# Patient Record
Sex: Female | Born: 1980 | Race: White | Hispanic: No | Marital: Married | Smoking: Never smoker
Health system: Southern US, Community
[De-identification: ages and names within clinical notes are randomized; demographics above are authoritative.]

## PROBLEM LIST (undated history)

## (undated) ENCOUNTER — Inpatient Hospital Stay: Payer: Self-pay

## (undated) DIAGNOSIS — R112 Nausea with vomiting, unspecified: Secondary | ICD-10-CM

## (undated) DIAGNOSIS — Z9889 Other specified postprocedural states: Secondary | ICD-10-CM

## (undated) DIAGNOSIS — R519 Headache, unspecified: Secondary | ICD-10-CM

## (undated) DIAGNOSIS — D649 Anemia, unspecified: Secondary | ICD-10-CM

## (undated) DIAGNOSIS — F32A Depression, unspecified: Secondary | ICD-10-CM

## (undated) DIAGNOSIS — N200 Calculus of kidney: Secondary | ICD-10-CM

## (undated) DIAGNOSIS — Z87442 Personal history of urinary calculi: Secondary | ICD-10-CM

---

## 2011-02-04 ENCOUNTER — Emergency Department: Payer: Self-pay | Admitting: *Deleted

## 2011-02-06 LAB — PATHOLOGY REPORT

## 2013-08-05 ENCOUNTER — Inpatient Hospital Stay: Payer: Self-pay | Admitting: Obstetrics and Gynecology

## 2013-08-05 LAB — CBC WITH DIFFERENTIAL/PLATELET
BASOS ABS: 0 10*3/uL (ref 0.0–0.1)
Basophil %: 0.2 %
EOS ABS: 0 10*3/uL (ref 0.0–0.7)
Eosinophil %: 0.3 %
HCT: 37.9 % (ref 35.0–47.0)
HGB: 13.2 g/dL (ref 12.0–16.0)
LYMPHS PCT: 21.4 %
Lymphocyte #: 1.7 10*3/uL (ref 1.0–3.6)
MCH: 32 pg (ref 26.0–34.0)
MCHC: 34.9 g/dL (ref 32.0–36.0)
MCV: 92 fL (ref 80–100)
Monocyte #: 0.4 x10 3/mm (ref 0.2–0.9)
Monocyte %: 5.4 %
NEUTROS ABS: 5.7 10*3/uL (ref 1.4–6.5)
NEUTROS PCT: 72.7 %
Platelet: 239 10*3/uL (ref 150–440)
RBC: 4.13 10*6/uL (ref 3.80–5.20)
RDW: 15.7 % — ABNORMAL HIGH (ref 11.5–14.5)
WBC: 7.8 10*3/uL (ref 3.6–11.0)

## 2013-08-07 LAB — CBC WITH DIFFERENTIAL/PLATELET
BASOS PCT: 0.3 %
Basophil #: 0 10*3/uL (ref 0.0–0.1)
Eosinophil #: 0 10*3/uL (ref 0.0–0.7)
Eosinophil %: 0.1 %
HCT: 28.8 % — ABNORMAL LOW (ref 35.0–47.0)
HGB: 9.8 g/dL — ABNORMAL LOW (ref 12.0–16.0)
Lymphocyte #: 1.2 10*3/uL (ref 1.0–3.6)
Lymphocyte %: 8.3 %
MCH: 31.1 pg (ref 26.0–34.0)
MCHC: 33.8 g/dL (ref 32.0–36.0)
MCV: 92 fL (ref 80–100)
MONO ABS: 0.5 x10 3/mm (ref 0.2–0.9)
MONOS PCT: 3.5 %
Neutrophil #: 12.7 10*3/uL — ABNORMAL HIGH (ref 1.4–6.5)
Neutrophil %: 87.8 %
Platelet: 172 10*3/uL (ref 150–440)
RBC: 3.14 10*6/uL — ABNORMAL LOW (ref 3.80–5.20)
RDW: 16.3 % — AB (ref 11.5–14.5)
WBC: 14.4 10*3/uL — AB (ref 3.6–11.0)

## 2013-08-08 LAB — HEMOGLOBIN: HGB: 9.2 g/dL — ABNORMAL LOW (ref 12.0–16.0)

## 2014-10-08 NOTE — Op Note (Signed)
PATIENT NAME:  Armen PickupNNAS, Callahan D MR#:  [id number] DATE OF BIRTH:  1980-10-29  DATE OF PROCEDURE:  08/06/2013  PREOPERATIVE DIAGNOSES: Fetal intolerance to labor with meconium staining remote on delivery. Oligohydramnios for induction.   POSTOPERATIVE DIAGNOSES: Fetal intolerance to labor with meconium staining remote on delivery. Oligohydramnios for induction.   PROCEDURES: Urgent lower uterine segment cesarean section.  SURGEON: Elliot Gurneyarrie C Venetta Knee, M.D.   ASSISTANT: Acquanetta BellingAngela Lugiano   FINDINGS: Term liveborn infant with Apgars 8 and 9, weighing 3040 grams.   DESCRIPTION OF PROCEDURE: The patient was taken to the Operating Room and placed in supine position. After adequate spinal and epidural anesthesia instilled, the patient was prepped and draped in the usual sterile fashion. A Pfannenstiel skin incision was made three fingerbreadths above the pubic symphysis and carried sharply down to the fascia. Fascia was nicked in the midline and the incision was extended in a superolateral manner. The fascia was sharply and bluntly dissected off the rectus muscles. Midline of the muscle was identified and split. Peritoneum was grasped, cut, entered. Bladder blade was placed. A bladder flap was created. Uterine incision was made. The infant's head was delivered, bulb suctioned. Nuchal cord was identified. Thick  meconium was seen. Cord was clamped and cut. Infant was handed off to the awaiting pediatrician. Pitocin was started, the uterus was exteriorized and wrapped in a moist laparotomy sponge. The interior of the uterus was curetted with a moist lap sponge. Uterine incision was closed with a running locked chromic suture and a running Vicryl suture. The bladder flap was tacked back up to the incision. Belly was cleared of clots. Uterus was placed back into the abdomen. The uterus was shifted to the right and left. the gutters were cleared of clots. The muscle bellies were approximated and sewn with a running Vicryl  suture. On-Q trocars were placed. On-Q catheters were threaded. The patient's fundus was massaged, 4-0 Monocryl was placed along the skin edges. Benzoin and Steri-Strips were placed. A 4 x 4 was placed around the catheter of the  On-Q trocar and the uterus was found to be firm and clear urine was noted in the Foley bag.   ____________________________ Elliot Gurneyarrie C. Jennafer Gladue, MD cck:sg D: 08/16/2013 11:48:00 ET T: 08/17/2013 06:41:28 ET JOB#: [id number]  cc: Elliot Gurneyarrie C. Esthefany Herrig, MD, <Dictator> Elliot GurneyARRIE C Lynnleigh Soden MD ELECTRONICALLY SIGNED 08/20/2013 23:29

## 2014-10-25 NOTE — H&P (Signed)
L&D Evaluation:  History:  HPI 31 G3P0 at 41 +0 weeks . Variable decels noted on office nst. U/S by me AFI 5.5 cm total. C 1 cm /40%/ blt vtx   Patient's Medical History anemia, migraines   Patient's Surgical History none   Medications Pre Natal Vitamins   Allergies motrin/ aleve   Social History none   Family History Non-Contributory   ROS:  ROS All systems were reviewed.  HEENT, CNS, GI, GU, Respiratory, CV, Renal and Musculoskeletal systems were found to be normal.   Exam:  Vital Signs stable   General no apparent distress   Mental Status clear   Chest clear   Heart normal sinus rhythm   Abdomen gravid, non-tender   Estimated Fetal Weight Average for gestational age   Fetal Position vtx   Back no CVAT   Edema no edema   Pelvic 1 cm/ 40 / blt / vtx   Mebranes Intact   FHT 140  + accels   FHT Description Variable decelerations   Ucx irregular   Skin dry   Impression:  Impression 41 weeks , relative oligohydramnios , variable decels   Plan:  Plan cervidil induction   Electronic Signatures: Schermerhorn, Ihor Austinhomas J (MD)  (Signed 19-Feb-15 12:51)  Authored: L&D Evaluation   Last Updated: 19-Feb-15 12:51 by Suzy BouchardSchermerhorn, Thomas J (MD)

## 2014-11-09 LAB — OB RESULTS CONSOLE RPR: RPR: NONREACTIVE

## 2014-11-09 LAB — OB RESULTS CONSOLE HEPATITIS B SURFACE ANTIGEN: Hepatitis B Surface Ag: NEGATIVE

## 2014-11-09 LAB — OB RESULTS CONSOLE RUBELLA ANTIBODY, IGM: Rubella: IMMUNE

## 2014-11-09 LAB — OB RESULTS CONSOLE VARICELLA ZOSTER ANTIBODY, IGG: VARICELLA IGG: IMMUNE

## 2015-03-10 ENCOUNTER — Encounter: Payer: Self-pay | Admitting: *Deleted

## 2015-03-10 ENCOUNTER — Observation Stay
Admission: EM | Admit: 2015-03-10 | Discharge: 2015-03-10 | Disposition: A | Discharge status: Home / Self Care | Payer: BLUE CROSS/BLUE SHIELD | Attending: Obstetrics and Gynecology | Admitting: Obstetrics and Gynecology

## 2015-03-10 DIAGNOSIS — Z041 Encounter for examination and observation following transport accident: Secondary | ICD-10-CM

## 2015-03-10 DIAGNOSIS — O26892 Other specified pregnancy related conditions, second trimester: Secondary | ICD-10-CM | POA: Diagnosis not present

## 2015-03-10 DIAGNOSIS — Z3A26 26 weeks gestation of pregnancy: Secondary | ICD-10-CM | POA: Insufficient documentation

## 2015-03-10 NOTE — Discharge Summary (Signed)
Patient discharged home with family in stable condition, ambulatory with steady gait. Discharge instructions reviewed with patient including follow up appointments and when to seek medical attention.
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
## 2015-06-18 NOTE — L&D Delivery Note (Addendum)
Delivery Note  First Stage: Labor onset: 06/20/15@0000  Augmentation : AROM, Pitocin  Analgesia /Anesthesia intrapartum: Morphine x 2, Fentanyl x1, then Epidural AROM at 1351 - clear fluid with bloody show  Second Stage: Complete dilation at 2236 Onset of pushing at 2238 FHR second stage: baseline: 125 bmp/ moderate variability/ +accels/ variables to nadir of 90bpm during pushing with good return to baseline   Delivery of a viable female at 2315 by Carlean JewsMeredith Sigmon, CNM in ROA position - terminal meconium at delivery  Loose nuchal cord x 1 and reduced over the fetal head Cord double clamped after cessation of pulsation, cut by FOB Cord blood sample collected   Third Stage: Placenta delivered via Tomasa BlaseSchultz intact with 3 VC @ 2320 Placenta disposition: hospital disposal Uterine tone firm with massage / bleeding moderate and proceeded to minimal   3rd laceration identified - see Dr. Francesca OmanSchermerhorn's dictation for repair Anesthesia for repair: Epidural and 1% Lidocaine Est. Blood Loss (mL): 350 Instruments, sponges, and needle counts correct by Landry DykeSigmon and Dougherty, RN  Successful VBAC - Dr. Feliberto GottronSchermerhorn at bedside as supervisory physician   Mom to postpartum.  Baby to Couplet care / Skin to Skin.  Newborn: Birth Weight: Pending  Apgar Scores: 8,9 Feeding planned: Breast  Carlean JewsMeredith Sigmon, CNM

## 2015-06-20 ENCOUNTER — Encounter
Admission: EM | Disposition: A | Discharge status: Home / Self Care | Payer: Self-pay | Source: Home / Self Care | Attending: Obstetrics and Gynecology

## 2015-06-20 ENCOUNTER — Inpatient Hospital Stay
Admission: EM | Admit: 2015-06-20 | Discharge: 2015-06-22 | DRG: 775 | Disposition: A | Discharge status: Home / Self Care | Payer: BLUE CROSS/BLUE SHIELD | Attending: Obstetrics and Gynecology | Admitting: Obstetrics and Gynecology

## 2015-06-20 ENCOUNTER — Inpatient Hospital Stay: Payer: BLUE CROSS/BLUE SHIELD | Admitting: Anesthesiology

## 2015-06-20 DIAGNOSIS — O34211 Maternal care for low transverse scar from previous cesarean delivery: Secondary | ICD-10-CM | POA: Diagnosis present

## 2015-06-20 DIAGNOSIS — Z3A40 40 weeks gestation of pregnancy: Secondary | ICD-10-CM

## 2015-06-20 DIAGNOSIS — O480 Post-term pregnancy: Secondary | ICD-10-CM | POA: Diagnosis present

## 2015-06-20 LAB — CBC
HEMATOCRIT: 36.6 % (ref 35.0–47.0)
HEMOGLOBIN: 11.9 g/dL — AB (ref 12.0–16.0)
MCH: 27.4 pg (ref 26.0–34.0)
MCHC: 32.6 g/dL (ref 32.0–36.0)
MCV: 83.8 fL (ref 80.0–100.0)
Platelets: 217 10*3/uL (ref 150–440)
RBC: 4.37 MIL/uL (ref 3.80–5.20)
RDW: 18.6 % — AB (ref 11.5–14.5)
WBC: 8.9 10*3/uL (ref 3.6–11.0)

## 2015-06-20 LAB — TYPE AND SCREEN
ABO/RH(D): O POS
ANTIBODY SCREEN: NEGATIVE

## 2015-06-20 LAB — ABO/RH: ABO/RH(D): O POS

## 2015-06-20 SURGERY — Surgical Case
Anesthesia: Spinal

## 2015-06-20 MED ORDER — LIDOCAINE-EPINEPHRINE (PF) 1.5 %-1:200000 IJ SOLN
INTRAMUSCULAR | Status: DC | PRN
  Administered 2015-06-20: 3 mL via EPIDURAL

## 2015-06-20 MED ORDER — LACTATED RINGERS IV SOLN
500.0000 mL | INTRAVENOUS | Status: DC | PRN
  Administered 2015-06-20: 1000 mL via INTRAVENOUS

## 2015-06-20 MED ORDER — AMMONIA AROMATIC IN INHA
RESPIRATORY_TRACT | Status: AC
  Filled 2015-06-20: qty 10

## 2015-06-20 MED ORDER — FENTANYL 2.5 MCG/ML W/ROPIVACAINE 0.2% IN NS 100 ML EPIDURAL INFUSION (ARMC-ANES)
EPIDURAL | Status: AC
  Administered 2015-06-20: 10 mL/h via EPIDURAL
  Filled 2015-06-20: qty 100

## 2015-06-20 MED ORDER — BUPIVACAINE HCL (PF) 0.25 % IJ SOLN
INTRAMUSCULAR | Status: DC | PRN
  Administered 2015-06-20 (×2): 4 mL via EPIDURAL

## 2015-06-20 MED ORDER — DIPHENHYDRAMINE HCL 50 MG/ML IJ SOLN: 12.5000 mg | INTRAMUSCULAR | Status: DC | PRN

## 2015-06-20 MED ORDER — MORPHINE SULFATE (PF) 2 MG/ML IV SOLN
INTRAVENOUS | Status: AC
  Administered 2015-06-20: 1 mg via INTRAVENOUS
  Filled 2015-06-20: qty 1

## 2015-06-20 MED ORDER — TERBUTALINE SULFATE 1 MG/ML IJ SOLN
INTRAMUSCULAR | Status: AC
  Administered 2015-06-20: 0.25 mg via SUBCUTANEOUS
  Filled 2015-06-20: qty 1

## 2015-06-20 MED ORDER — TERBUTALINE SULFATE 1 MG/ML IJ SOLN: 0.2500 mg | Freq: Once | INTRAMUSCULAR | Status: DC | PRN

## 2015-06-20 MED ORDER — PHENYLEPHRINE 40 MCG/ML (10ML) SYRINGE FOR IV PUSH (FOR BLOOD PRESSURE SUPPORT)
80.0000 ug | PREFILLED_SYRINGE | INTRAVENOUS | Status: DC | PRN
  Filled 2015-06-20: qty 2

## 2015-06-20 MED ORDER — LACTATED RINGERS IV SOLN
INTRAVENOUS | Status: DC
  Administered 2015-06-20: 09:00:00 via INTRAVENOUS

## 2015-06-20 MED ORDER — ONDANSETRON HCL 4 MG/2ML IJ SOLN
INTRAMUSCULAR | Status: AC
  Administered 2015-06-20: 4 mg via INTRAVENOUS
  Filled 2015-06-20: qty 2

## 2015-06-20 MED ORDER — FENTANYL 2.5 MCG/ML W/ROPIVACAINE 0.2% IN NS 100 ML EPIDURAL INFUSION (ARMC-ANES)
9.0000 mL/h | EPIDURAL | Status: DC
Start: 2015-06-20 — End: 2015-06-21
  Administered 2015-06-20: 10 mL/h via EPIDURAL
  Filled 2015-06-20: qty 100

## 2015-06-20 MED ORDER — LACTATED RINGERS IV SOLN
500.0000 mL | INTRAVENOUS | Status: DC | PRN
Start: 2015-06-20 — End: 2015-06-21
  Administered 2015-06-20: 500 mL via INTRAVENOUS

## 2015-06-20 MED ORDER — FENTANYL CITRATE (PF) 100 MCG/2ML IJ SOLN
INTRAMUSCULAR | Status: AC
  Administered 2015-06-20: 50 ug via INTRAVENOUS
  Filled 2015-06-20: qty 2

## 2015-06-20 MED ORDER — MORPHINE SULFATE (PF) 2 MG/ML IV SOLN
1.0000 mg | Freq: Once | INTRAVENOUS | Status: AC | PRN
  Administered 2015-06-20 (×2): 1 mg via INTRAVENOUS

## 2015-06-20 MED ORDER — AMMONIA AROMATIC IN INHA
RESPIRATORY_TRACT | Status: AC
  Administered 2015-06-20: 14:00:00
  Filled 2015-06-20: qty 10

## 2015-06-20 MED ORDER — LACTATED RINGERS IV SOLN: INTRAVENOUS | Status: DC

## 2015-06-20 MED ORDER — FENTANYL CITRATE (PF) 100 MCG/2ML IJ SOLN
50.0000 ug | INTRAMUSCULAR | Status: DC | PRN
  Administered 2015-06-20: 50 ug via INTRAVENOUS

## 2015-06-20 MED ORDER — ONDANSETRON HCL 4 MG/2ML IJ SOLN
4.0000 mg | Freq: Once | INTRAMUSCULAR | Status: AC
  Administered 2015-06-20: 4 mg via INTRAVENOUS

## 2015-06-20 MED ORDER — EPHEDRINE 5 MG/ML INJ
10.0000 mg | INTRAVENOUS | Status: DC | PRN
  Filled 2015-06-20: qty 2

## 2015-06-20 MED ORDER — OXYTOCIN BOLUS FROM INFUSION: 500.0000 mL | INTRAVENOUS | Status: DC

## 2015-06-20 MED ORDER — LIDOCAINE HCL (PF) 1 % IJ SOLN
30.0000 mL | INTRAMUSCULAR | Status: DC | PRN
  Filled 2015-06-20: qty 30

## 2015-06-20 MED ORDER — MISOPROSTOL 200 MCG PO TABS
ORAL_TABLET | ORAL | Status: AC
  Filled 2015-06-20: qty 4

## 2015-06-20 MED ORDER — OXYTOCIN 40 UNITS IN LACTATED RINGERS INFUSION - SIMPLE MED
62.5000 mL/h | INTRAVENOUS | Status: DC
  Filled 2015-06-20: qty 1000

## 2015-06-20 MED ORDER — MORPHINE SULFATE (PF) 2 MG/ML IV SOLN: 1.0000 mg | INTRAVENOUS | Status: DC | PRN

## 2015-06-20 MED ORDER — LIDOCAINE HCL (PF) 1 % IJ SOLN
INTRAMUSCULAR | Status: AC
  Filled 2015-06-20: qty 30

## 2015-06-20 MED ORDER — OXYTOCIN 10 UNIT/ML IJ SOLN
INTRAMUSCULAR | Status: AC
  Filled 2015-06-20: qty 2

## 2015-06-20 MED ORDER — CITRIC ACID-SODIUM CITRATE 334-500 MG/5ML PO SOLN
30.0000 mL | ORAL | Status: DC | PRN
  Administered 2015-06-20: 30 mL via ORAL
  Filled 2015-06-20: qty 15

## 2015-06-20 MED ORDER — BUTORPHANOL TARTRATE 1 MG/ML IJ SOLN: 1.0000 mg | INTRAMUSCULAR | Status: DC | PRN

## 2015-06-20 MED ORDER — ACETAMINOPHEN 325 MG PO TABS: 650.0000 mg | ORAL_TABLET | ORAL | Status: DC | PRN

## 2015-06-20 MED ORDER — OXYTOCIN 40 UNITS IN LACTATED RINGERS INFUSION - SIMPLE MED
1.0000 m[IU]/min | INTRAVENOUS | Status: DC
  Administered 2015-06-20: 1 m[IU]/min via INTRAVENOUS
  Administered 2015-06-20: 3 m[IU]/min via INTRAVENOUS
  Administered 2015-06-20: 1 m[IU]/min via INTRAVENOUS

## 2015-06-20 MED ORDER — TERBUTALINE SULFATE 1 MG/ML IJ SOLN
0.2500 mg | Freq: Once | INTRAMUSCULAR | Status: AC
  Administered 2015-06-20: 0.25 mg via SUBCUTANEOUS

## 2015-06-20 MED ORDER — ONDANSETRON HCL 4 MG/2ML IJ SOLN
4.0000 mg | Freq: Four times a day (QID) | INTRAMUSCULAR | Status: DC | PRN
  Administered 2015-06-20: 4 mg via INTRAVENOUS
  Filled 2015-06-20: qty 2

## 2015-06-20 SURGICAL SUPPLY — 21 items
BARRIER ADHS 3X4 INTERCEED (GAUZE/BANDAGES/DRESSINGS) IMPLANT
CANISTER SUCT 3000ML (MISCELLANEOUS) IMPLANT
CATH KIT ON-Q SILVERSOAK 5IN (CATHETERS) IMPLANT
CHLORAPREP W/TINT 26ML (MISCELLANEOUS) IMPLANT
DRSG TELFA 3X8 NADH (GAUZE/BANDAGES/DRESSINGS) IMPLANT
ELECT CAUTERY BLADE 6.4 (BLADE) IMPLANT
GAUZE SPONGE 4X4 12PLY STRL (GAUZE/BANDAGES/DRESSINGS) IMPLANT
GLOVE BIO SURGEON STRL SZ8 (GLOVE) IMPLANT
GOWN STRL REUS W/ TWL LRG LVL3 (GOWN DISPOSABLE) IMPLANT
GOWN STRL REUS W/ TWL XL LVL3 (GOWN DISPOSABLE) IMPLANT
GOWN STRL REUS W/TWL LRG LVL3 (GOWN DISPOSABLE)
GOWN STRL REUS W/TWL XL LVL3 (GOWN DISPOSABLE)
NS IRRIG 1000ML POUR BTL (IV SOLUTION) IMPLANT
PACK C SECTION AR (MISCELLANEOUS) IMPLANT
PAD GROUND ADULT SPLIT (MISCELLANEOUS) IMPLANT
PAD OB MATERNITY 4.3X12.25 (PERSONAL CARE ITEMS) IMPLANT
PAD PREP 24X41 OB/GYN DISP (PERSONAL CARE ITEMS) IMPLANT
STRAP SAFETY BODY (MISCELLANEOUS) IMPLANT
SUT CHROMIC 1 CTX 36 (SUTURE) IMPLANT
SUT PLAIN GUT 0 (SUTURE) IMPLANT
SUT VIC AB 0 CT1 36 (SUTURE) IMPLANT

## 2015-06-20 NOTE — Progress Notes (Signed)
Patient ID: Krista [name], female   DOB: 08/15/1980, 35 y.o.   MRN: [id number] Cle in place . Recent cx check 5-6 cm . Pitocin off with MVU 170-180 Reassuring fetal monitoring  Spoke to the pt and she wishes to continue on with VBAC  Recheck cx at 2000 and reeval .

## 2015-06-20 NOTE — Anesthesia Procedure Notes (Signed)
Epidural Patient location during procedure: OB Start time: 06/20/2015 11:32 AM End time: 06/20/2015 11:35 AM  Staffing Resident/CRNA: Stormy FabianURTIS, Livier Hendel Performed by: resident/CRNA   Preanesthetic Checklist Completed: patient identified, site marked, surgical consent, pre-op evaluation, timeout performed, IV checked, risks and benefits discussed and monitors and equipment checked  Epidural Patient position: sitting Prep: Betadine Patient monitoring: heart rate, continuous pulse ox and blood pressure Approach: midline Location: L4-L5 Injection technique: LOR saline  Needle:  Needle type: Tuohy  Needle gauge: 18 G Needle length: 9 cm and 9 Needle insertion depth: 5 cm Catheter type: closed end flexible Catheter size: 20 Guage Catheter at skin depth: 9 cm Test dose: negative and 1.5% lidocaine with Epi 1:200 K  Assessment Sensory level: T10 Events: blood not aspirated, injection not painful, no injection resistance, negative IV test and no paresthesia  Additional Notes   Patient tolerated the insertion well without complications.-SATD -IVTD. No paresthesia. [facility] for VS and dosingReason for block:procedure for pain

## 2015-06-20 NOTE — Plan of Care (Signed)
CNM Sigmon at bedside for assessment and SVE. Plan to place IUPC for accurate uc tracing. Pt turned from side to her back. Immediate decel noted to 90's, patient states she does not feel well. B/P taken and had dropped low. IV fluid bolus infusing. Ammonia capsule to pt's face. Her color is pale and greyish lip color.  Turned to Left side lying posiiton. Pt states she is feeling improved at this time (1343). B/P improving.  Color returning to lips and face.  CNM remains at bedside. MD Schermerhorn called to confirm fetal position with US after CNM unsure with last SVE. Will inform MD of present.  Ellison Carwin Donaven Criswell RNC

## 2015-06-20 NOTE — Anesthesia Preprocedure Evaluation (Addendum)
Anesthesia Evaluation  Patient identified by MRN, date of birth, ID band Patient awake    Reviewed: Allergy & Precautions, H&P , NPO status , Patient's Chart, lab work & pertinent test results, reviewed documented beta blocker date and time   History of Anesthesia Complications Negative for: history of anesthetic complications  Airway Mallampati: III  TM Distance: >3 FB Neck ROM: full    Dental no notable dental hx. (+) Teeth Intact   Pulmonary neg pulmonary ROS,    Pulmonary exam normal breath sounds clear to auscultation       Cardiovascular Exercise Tolerance: Good negative cardio ROS Normal cardiovascular exam Rhythm:regular Rate:Normal     Neuro/Psych negative neurological ROS  negative psych ROS   GI/Hepatic negative GI ROS, Neg liver ROS,   Endo/Other  negative endocrine ROS  Renal/GU negative Renal ROS  negative genitourinary   Musculoskeletal   Abdominal   Peds  Hematology negative hematology ROS (+)   Anesthesia Other Findings   Reproductive/Obstetrics (+) Pregnancy                            Anesthesia Physical Anesthesia Plan  ASA: II  Anesthesia Plan: Regional and Epidural   Post-op Pain Management:    Induction:   Airway Management Planned:   Additional Equipment:   Intra-op Plan:   Post-operative Plan:   Informed Consent: I have reviewed the patients History and Physical, chart, labs and discussed the procedure including the risks, benefits and alternatives for the proposed anesthesia with the patient or authorized representative who has indicated his/her understanding and acceptance.     Plan Discussed with: CRNA  Anesthesia Plan Comments:         Anesthesia Quick Evaluation

## 2015-06-20 NOTE — Progress Notes (Addendum)
S:  Pt. Feeling nauseated and asking for medication  O:  VS: Blood pressure 95/51, pulse 88, temperature 98.1 F (36.7 C), temperature source Oral, resp. rate 18, height 5' (1.524 m), weight 76.204 kg (168 lb), SpO2 99 %.        FHR : baseline 120 bpm / variability Moderate / accelerations + / occasional variable and late decelerations with good return to baseline and good variability         Toco: contractions every 1-3 minutes / Moderate / MVUs - 180        Cervix : 5-6cm/90%/-1/vtx        Membranes: SROM - bloody show with few small clots  A: Active labor     FHR category 2  P: Dr. Feliberto GottronSchermerhorn at beside for evaluation - pt. Elects to proceed with VBAC     Reassess at 8 pm      Continue with pitocin off       Carlean JewsMeredith Tereso Unangst, CNM

## 2015-06-20 NOTE — OB Triage Note (Signed)
Pt presents to L&D with c/o contractions since 7am, but getting more regular around 0000. Reports good fetal movements, denies Leaking of fluid or vaginal bleeding. Plan to apply efm and toco, evaluate fetal and maternal well being and assess for labor.

## 2015-06-20 NOTE — H&P (Signed)
  OB ADMISSION/ HISTORY & PHYSICAL:  Admission Date: 06/20/2015  3:10 AM  Admit Diagnosis: 40+5 weeks previous LTCS with labor contractions   Krista [name] is a 35 y.o. female presenting for labor contractions at 40+5 weeks.  She is a previous LTCS by TJS in 2015 for Fetal distress.  She is planning to TOLAC/VBAC.    Prenatal History: [id number]   EDC : 06/15/2015, by LMP  [facility]  Prenatal course complicated by previous LTCS for fetal distress in 2015, history of PP depression - not currently on medications   Prenatal Labs: ABO, Rh:  O Positive Antibody:  Negative Rubella:   Immune Varicella: Immune  RPR:   NR HBsAg:   Negative HIV:   NR GTT: 88 GBS:   Negative  Medical / Surgical History :  Past medical history: History reviewed. No pertinent past medical history.   Past surgical history: History reviewed. No pertinent past surgical history.  Family History: History reviewed. No pertinent family history.   Social History:  reports that she has never smoked. She does not have any smokeless tobacco history on file. She reports that she does not drink alcohol or use illicit drugs.   Allergies: Ibuprofen and Naproxen    Current Medications at time of admission:  Prior to Admission medications   Medication Sig Start Date End Date Taking? Authorizing Provider  Prenatal Vit-Fe Fumarate-FA (PRENATAL MULTIVITAMIN) TABS tablet Take 1 tablet by mouth daily at 12 noon.    Historical Provider, MD     Review of Systems: Active FM onset of ctx @ 0000 currently every 2-10 minutes No LOF bloody show present  +nausea, +vomiting, no diarrhea, no constipation No SOB, CP, heart palpitations  Morphine x 2 has helped with pain    Physical Exam:  VS: Blood pressure 129/71, pulse 102, temperature 97.8 F (36.6 C), temperature source Oral, resp. rate 18, height 5' (1.524 m), weight 76.204 kg (168 lb).  General: alert and oriented, appears calm  Heart:  RRR Lungs: Clear lung fields Abdomen: Gravid, soft and non-tender, non-distended / uterus: non-tender / contractions palpated moderate Extremities: no edema  Genitalia / VE: Dilation: 3.5 Effacement (%): 80 Station: -1 Exam by:: MS CNM  FHR: baseline rate 125 bpm / variability Moderate / accelerations + / occasional variable and late decelerations with nadir to 90 bpm TOCO: every 2-10 minutes   Assessment: 40+[redacted]  weeks gestation Previous C/S - planning VBAC Latent stage of labor FHR category 2   Plan:  Admit to Birthplace Routine Labor and Delivery Orders Planning epidural when labs get back Consents for VBAC and C/S are signed  Dr. Feliberto GottronSchermerhorn notified of admission / plan of care  Carlean JewsMeredith Nyiesha Beever, CNM

## 2015-06-20 NOTE — Progress Notes (Signed)
S: Pt. Comfortable with epidural       Pitocin at 3 milliunits   O:  VS: Blood pressure 110/68, pulse 79, temperature 98.1 F (36.7 C), temperature source Oral, resp. rate 18, height 5' (1.524 m), weight 76.204 kg (168 lb), SpO2 99 %.        FHR : baseline 120 bpm / variability Moderate / accelerations +/ variable and late decelerations with contractions        Toco: contractions every 1.5-3 minutes / moderate / MVU 160-180        Cervix : Dilation: 3.5 Effacement (%): 80 Cervical Position: Middle Station: -1 Presentation: Vertex (remains slightly ballotable per MD) Exam by:: TJS        Membranes: AROM -clear/bloody show  A: Latent labor     FHR category 2  P: Pitocin off for category 2 strip      Position changed    Dr. Feliberto GottronSchermerhorn notified of fetal strip - he will come discuss with patient soon  Carlean JewsMeredith Adelfa Lozito, CNM

## 2015-06-20 NOTE — Progress Notes (Signed)
S:  Increase in nausea and heartburn  O:  VS: Blood pressure 106/65, pulse 96, temperature 98.7 F (37.1 C), temperature source Oral, resp. rate 18, height 5' (1.524 Krista), weight 76.204 kg (168 lb), SpO2 99 %.        FHR : baseline 125 bmp/ variability Modearate / accelerations + / occasional variable and early decelerations        Toco: contractions every 1-3 minutes / Moderate/ MVU 190-200        Cervix : Dilation: 9 Effacement (%): 90 Cervical Position: Middle Station: 0 Presentation: Vertex Exam by:: Krista [name],CNM        Membranes: AROM - moderate amounts of bright red vaginal bleeding  A: Active labor     FHR category 2     TOLAC  P: Sodium Citrate x 1 for heartburn      Dr. Feliberto GottronSchermerhorn notified - aware/ in-house      Continue expectant management       Titrate epidural down by 1 mg for patient to feel pressure     Anticipate VBAC  Krista [name], CNM

## 2015-06-20 NOTE — Progress Notes (Addendum)
Patient ID: Krista [name], female   DOB: 04/04/1981, 35 y.o.   MRN: [id number] CLE in place . Inadequate CTx pattern . VTX by U/s by me . Arom some bloody d/c . IUPC placed . Start Pitocin slowly if not adequate ctx pattern . Reassuring fetal monitoring .

## 2015-06-20 NOTE — Progress Notes (Signed)
S:  Pt. Feeling more pressure, nausea has resolved   O:  VS: Blood pressure 107/65, pulse 93, temperature 98.3 F (36.8 C), temperature source Oral, resp. rate 18, height 5' (1.524 m), weight 76.204 kg (168 lb), SpO2 99 %.        FHR : baseline 125 bpm / variability Moderate / accelerations + / occasional variable decelerations        Toco: contractions every 1-3 minutes / Moderate / MVU-150        Cervix : 7cm/90%/0/vtx        Membranes: clear with moderate bloody show  A: Active labor     FHR category 2   P: Fetal monitoring strip is reactive and reassuring      Restart low dose Pitocin 1milliunit x 1milliunit     Continue expectant management     Dr. Feliberto GottronSchermerhorn aware and agrees      Carlean JewsMeredith Lucio Litsey, CNM

## 2015-06-21 LAB — CBC
HEMATOCRIT: 33.6 % — AB (ref 35.0–47.0)
HEMOGLOBIN: 11.1 g/dL — AB (ref 12.0–16.0)
MCH: 28 pg (ref 26.0–34.0)
MCHC: 33 g/dL (ref 32.0–36.0)
MCV: 84.9 fL (ref 80.0–100.0)
Platelets: 205 10*3/uL (ref 150–440)
RBC: 3.95 MIL/uL (ref 3.80–5.20)
RDW: 19.6 % — ABNORMAL HIGH (ref 11.5–14.5)
WBC: 16 10*3/uL — ABNORMAL HIGH (ref 3.6–11.0)

## 2015-06-21 LAB — RPR: RPR Ser Ql: NONREACTIVE

## 2015-06-21 MED ORDER — PRENATAL MULTIVITAMIN CH
1.0000 | ORAL_TABLET | Freq: Every day | ORAL | Status: DC
  Administered 2015-06-21: 1 via ORAL
  Filled 2015-06-21: qty 1

## 2015-06-21 MED ORDER — ZOLPIDEM TARTRATE 5 MG PO TABS: 5.0000 mg | ORAL_TABLET | Freq: Every evening | ORAL | Status: DC | PRN

## 2015-06-21 MED ORDER — ONDANSETRON HCL 4 MG/2ML IJ SOLN: 4.0000 mg | INTRAMUSCULAR | Status: DC | PRN

## 2015-06-21 MED ORDER — OXYCODONE-ACETAMINOPHEN 5-325 MG PO TABS: 2.0000 | ORAL_TABLET | ORAL | Status: DC | PRN

## 2015-06-21 MED ORDER — BENZOCAINE-MENTHOL 20-0.5 % EX AERO
1.0000 "application " | INHALATION_SPRAY | CUTANEOUS | Status: DC | PRN
  Filled 2015-06-21 (×2): qty 56

## 2015-06-21 MED ORDER — DIBUCAINE 1 % RE OINT: 1.0000 "application " | TOPICAL_OINTMENT | RECTAL | Status: DC | PRN

## 2015-06-21 MED ORDER — DOCUSATE SODIUM 100 MG PO CAPS: 100.0000 mg | ORAL_CAPSULE | Freq: Two times a day (BID) | ORAL | Status: DC

## 2015-06-21 MED ORDER — SIMETHICONE 80 MG PO CHEW
80.0000 mg | CHEWABLE_TABLET | ORAL | Status: DC | PRN
  Administered 2015-06-21: 80 mg via ORAL
  Filled 2015-06-21: qty 1

## 2015-06-21 MED ORDER — PSYLLIUM 95 % PO PACK
1.0000 | PACK | Freq: Every day | ORAL | Status: DC
  Administered 2015-06-21 – 2015-06-22 (×2): 1 via ORAL
  Filled 2015-06-21 (×2): qty 1

## 2015-06-21 MED ORDER — MAGNESIUM HYDROXIDE 400 MG/5ML PO SUSP: 30.0000 mL | ORAL | Status: DC | PRN

## 2015-06-21 MED ORDER — OXYCODONE-ACETAMINOPHEN 5-325 MG PO TABS
1.0000 | ORAL_TABLET | ORAL | Status: DC | PRN
  Administered 2015-06-21: 1 via ORAL
  Administered 2015-06-21: 0.5 via ORAL
  Administered 2015-06-21: 1 via ORAL
  Administered 2015-06-21: 0.5 via ORAL
  Administered 2015-06-21 – 2015-06-22 (×2): 1 via ORAL
  Filled 2015-06-21 (×6): qty 1

## 2015-06-21 MED ORDER — ONDANSETRON HCL 4 MG PO TABS
4.0000 mg | ORAL_TABLET | ORAL | Status: DC | PRN
Start: 2015-06-21 — End: 2015-06-22

## 2015-06-21 MED ORDER — DOCUSATE SODIUM 100 MG PO CAPS
100.0000 mg | ORAL_CAPSULE | Freq: Two times a day (BID) | ORAL | Status: DC
  Administered 2015-06-21 – 2015-06-22 (×3): 100 mg via ORAL
  Filled 2015-06-21 (×3): qty 1

## 2015-06-21 MED ORDER — SENNOSIDES-DOCUSATE SODIUM 8.6-50 MG PO TABS: 2.0000 | ORAL_TABLET | ORAL | Status: DC

## 2015-06-21 MED ORDER — LANOLIN HYDROUS EX OINT: TOPICAL_OINTMENT | CUTANEOUS | Status: DC | PRN

## 2015-06-21 MED ORDER — DIPHENHYDRAMINE HCL 25 MG PO CAPS: 25.0000 mg | ORAL_CAPSULE | Freq: Four times a day (QID) | ORAL | Status: DC | PRN

## 2015-06-21 MED ORDER — ACETAMINOPHEN 325 MG PO TABS: 650.0000 mg | ORAL_TABLET | ORAL | Status: DC | PRN

## 2015-06-21 MED ORDER — WITCH HAZEL-GLYCERIN EX PADS
1.0000 | MEDICATED_PAD | CUTANEOUS | Status: DC | PRN
Start: 2015-06-21 — End: 2015-06-22

## 2015-06-21 NOTE — Progress Notes (Signed)
PPD #1, VBAC  S:  Reports feeling good, did not get as much rest last night, having some vaginal/perineal pain but relief with pain medication             Tolerating po/ No nausea or vomiting             Bleeding is light             Pain controlled withTylenol and Percocet              Up ad lib / ambulatory / voiding QS  Newborn breast feeding - going well   O:               VS: BP 110/56 mmHg  Pulse 103  Temp(Src) 98.7 F (37.1 C) (Oral)  Resp 18  Ht 5' (1.524 m)  Wt 76.204 kg (168 lb)  BMI 32.81 kg/m2  SpO2 98%  Breastfeeding? Unknown   LABS:               Recent Labs  06/20/15 0924 06/21/15 0653  WBC 8.9 16.0*  HGB 11.9* 11.1*  PLT 217 205               Blood type: --/--/O POS (01/03 0925)  Rubella: Immune (05/25 0000)                     I&O: Intake/Output      01/03 0701 - 01/04 0700 01/04 0701 - 01/05 0700   I.V. (mL/kg) 1906.4 (25)    Total Intake(mL/kg) 1906.4 (25)    Urine (mL/kg/hr) 1550 (0.8)    Blood 700 (0.4)    Total Output 2250     Net -343.6                        Physical Exam:             Alert and oriented X3  Lungs: Clear and unlabored  Heart: regular rate and rhythm / no mumurs  Abdomen: soft, non-tender, non-distended              Fundus: firm, non-tender, U-E  Perineum: well approximated 3rd degree laceration healing well - no significant erythema, slight edema   Lochia: light, no clots noted   Extremities: no edema, no calf pain or tenderness    A: PPD # 1, VBAC  Doing well - stable status  P: Routine post partum orders  See Lactation today  May shower today and ambulate in halls  Colace BID and Metamucil daily   Encourage Dermoplast spray   Anticipate discharge home tomorrow   Carlean JewsMeredith Maui Ahart, CNM

## 2015-06-21 NOTE — Progress Notes (Signed)
Pt was assisted to bathroom, voided, shown pericare, clean gown and pad with icepack on.  Pt was then transferred via w/c to PP rm 346 in stable condition with IV infusing.

## 2015-06-21 NOTE — Op Note (Signed)
NAMTresa Res:  [name], Krista [name]              ACCOUNT NO.:  [id number]  MEDICAL RECORD NO.:  [id number]  LOCATION:  LDR4                         FACILITY:  [facility]  PHYSICIAN:  Jennell Cornerhomas Arden Axon, MDDATE OF BIRTH:  July 06, 1980  DATE OF PROCEDURE:  06/21/2015 DATE OF DISCHARGE:                              OPERATIVE REPORT   PREOPERATIVE DIAGNOSIS:  Third-degree perineal laceration status post spontaneous vaginal delivery.  POSTOPERATIVE DIAGNOSIS:  Third-degree perineal laceration status post spontaneous vaginal delivery.  PROCEDURE:  Repair of third-degree perineal laceration.  ANESTHESIA:  Continuous lumbar epidural and 1% local anesthetic.  DESCRIPTION OF PROCEDURE:  The patient was delivered by Sherrilee GillesMeredith Sigman, certified nurse midwife with me as the supervisory staff.  After the placenta was delivered, it was noted that the patient had a third-degree laceration.  Cervix was visualized and there were no apparent lacerations of the cervix.  A 1% lidocaine was used to anesthetize the sphincter capsule.  Each capsule was grasped with an Allis clamp with healthy capsular tissue noted.  Rectal exam was performed at this point, which demonstrated no mucosal tears.  The sphincter muscle was reapproximated with 2 separate figure-of-eight sutures with good approximation of the muscle.  The rectal exam again demonstrated good repair.  The vaginal laceration was noted with the apex identified and a running locking 2-0 Vicryl suture was used to reapproximate the vaginal laceration.  Several additional figure-of-eight sutures were required for good hemostasis.  The perineal body was reapproximated with 2 separate crown sutures using 2-0 Vicryl suture, good approximation of the perineal body and a subcuticular suture was used to reapproximate the skin with 3-0 Vicryl suture.  A final rectal exam was performed. Good tone noted.  No defects noted.  No suture in the rectum, and adequate repair with good  hemostasis.  Please reference nurse-midwife Sigmun's delivery note for additional details.          ______________________________ Jennell Cornerhomas Yamir Carignan, MD     TS/MEDQ  D:  06/21/2015  T:  06/21/2015  Job:  [id number]

## 2015-06-21 NOTE — Progress Notes (Signed)
Pt complete and setup for pushing. M. Sigmon, CNM at bedside.

## 2015-06-21 NOTE — Progress Notes (Signed)
Pt began vomitting again.   Clean linens and emesis bag

## 2015-06-21 NOTE — Anesthesia Postprocedure Evaluation (Signed)
Anesthesia Post Note  Patient: Krista [name]  Procedure(s) Performed: * No procedures listed *  Patient location during evaluation: Mother Baby Anesthesia Type: Epidural Level of consciousness: awake, awake and alert and oriented Pain management: pain level controlled Vital Signs Assessment: post-procedure vital signs reviewed and stable Respiratory status: spontaneous breathing and nonlabored ventilation Cardiovascular status: blood pressure returned to baseline and stable Postop Assessment: no headache and no backache Anesthetic complications: no    Last Vitals:  Filed Vitals:   06/21/15 0208 06/21/15 0429  BP: 114/68 110/56  Pulse: 97 103  Temp: 37 C 37.1 C  Resp: 20 18    Last Pain:  Filed Vitals:   06/21/15 0429  PainSc: 2                  Ambrose Wile,  Sheran FavaMark R

## 2015-06-21 NOTE — Progress Notes (Signed)
Patient ID: Armen PickupKimberly D Gladd, female   DOB: 05/27/1981, 35 y.o.   MRN: [id number] 3rd degree laceration repaired by me .  See op note dictated tonight.

## 2015-06-22 MED ORDER — OXYCODONE-ACETAMINOPHEN 5-325 MG PO TABS: 1.0000 | ORAL_TABLET | ORAL | Status: DC | PRN

## 2015-06-22 MED ORDER — DOCUSATE SODIUM 100 MG PO CAPS: 100.0000 mg | ORAL_CAPSULE | Freq: Two times a day (BID) | ORAL | Status: DC

## 2015-06-22 NOTE — Discharge Summary (Signed)
Obstetric Discharge Summary Reason for Admission: VBAC Prenatal Procedures: none Intrapartum Procedures: GBS prophylaxis Postpartum Procedures: repair of a third degree tear Complications-Operative and Postpartum: 3 degree perineal laceration HEMOGLOBIN  Date Value Ref Range Status  06/21/2015 11.1* 12.0 - 16.0 g/dL Final   HGB  Date Value Ref Range Status  08/08/2013 9.2* 12.0-16.0 g/dL Final   HCT  Date Value Ref Range Status  06/21/2015 33.6* 35.0 - 47.0 % Final  08/07/2013 28.8* 35.0-47.0 % Final    Physical Exam:  General: alert and cooperative  Lungs cta  cv RRR Lochia: appropriate Uterine Fundus: firm Incision: healing well DVT Evaluation: No evidence of DVT seen on physical exam.  Discharge Diagnoses: Term Pregnancy-delivered and third degree lac , successful VBAC  Discharge Information: Date: 06/22/2015 Activity: pelvic rest Diet: routine Medications: Colace, Percocet and metamucil Condition: stable Instructions: refer to practice specific booklet Discharge to: home Follow-up Information    Follow up with Celia Gibbons, MD In 2 weeks.   Specialty:  Obstetrics and Gynecology   Why:  For wound re-check   Contact information:   [address] [facility] [place] [address] [phone]       Newborn Data: Live born female  Birth Weight: 6 lb 14.8 oz (3140 g) APGAR: 8, 9  Home with mother.  Rilen Shukla 06/22/2015, 9:21 AM

## 2015-06-22 NOTE — Discharge Instructions (Signed)
Postpartum Care After Vaginal Delivery °After you deliver your newborn (postpartum period), the usual stay in the hospital is 24-72 hours. If there were problems with your labor or delivery, or if you have other medical problems, you might be in the hospital longer.  °While you are in the hospital, you will receive help and instructions on how to care for yourself and your newborn during the postpartum period.  °While you are in the hospital: °· Be sure to tell your nurses if you have pain or discomfort, as well as where you feel the pain and what makes the pain worse. °· If you had an incision made near your vagina (episiotomy) or if you had some tearing during delivery, the nurses may put ice packs on your episiotomy or tear. The ice packs may help to reduce the pain and swelling. °· If you are breastfeeding, you may feel uncomfortable contractions of your uterus for a couple of weeks. This is normal. The contractions help your uterus get back to normal size. °· It is normal to have some bleeding after delivery. °¨ For the first 1-3 days after delivery, the flow is red and the amount may be similar to a period. °¨ It is common for the flow to start and stop. °¨ In the first few days, you may pass some small clots. Let your nurses know if you begin to pass large clots or your flow increases. °¨ Do not  flush blood clots down the toilet before having the nurse look at them. °¨ During the next 3-10 days after delivery, your flow should become more watery and pink or brown-tinged in color. °¨ Ten to fourteen days after delivery, your flow should be a small amount of yellowish-white discharge. °¨ The amount of your flow will decrease over the first few weeks after delivery. Your flow may stop in 6-8 weeks. Most women have had their flow stop by 12 weeks after delivery. °· You should change your sanitary pads frequently. °· Wash your hands thoroughly with soap and water for at least 20 seconds after changing pads, using  the toilet, or before holding or feeding your newborn. °· You should feel like you need to empty your bladder within the first 6-8 hours after delivery. °· In case you become weak, lightheaded, or faint, call your nurse before you get out of bed for the first time and before you take a shower for the first time. °· Within the first few days after delivery, your breasts may begin to feel tender and full. This is called engorgement. Breast tenderness usually goes away within 48-72 hours after engorgement occurs. You may also notice milk leaking from your breasts. If you are not breastfeeding, do not stimulate your breasts. Breast stimulation can make your breasts produce more milk. °· Spending as much time as possible with your newborn is very important. During this time, you and your newborn can feel close and get to know each other. Having your newborn stay in your room (rooming in) will help to strengthen the bond with your newborn.  It will give you time to get to know your newborn and become comfortable caring for your newborn. °· Your hormones change after delivery. Sometimes the hormone changes can temporarily cause you to feel sad or tearful. These feelings should not last more than a few days. If these feelings last longer than that, you should talk to your caregiver. °· If desired, talk to your caregiver about methods of family planning or contraception. °·   Talk to your caregiver about immunizations. Your caregiver may want you to have the following immunizations before leaving the hospital:  Tetanus, diphtheria, and pertussis (Tdap) or tetanus and diphtheria (Td) immunization. It is very important that you and your family (including grandparents) or others caring for your newborn are up-to-date with the Tdap or Td immunizations. The Tdap or Td immunization can help protect your newborn from getting ill.  Rubella immunization.  Varicella (chickenpox) immunization.  Influenza immunization. You should  receive this annual immunization if you did not receive the immunization during your pregnancy.   Please call your doctor or return to the ER if you experience any chest pains, shortness of breath, fever greater than 101, any heavy bleeding or large clots, and foul smelling vaginal discharge, any worsening abdominal pain & cramping that is not controlled by pain medication, or any signs of post partum depression.  No tampons, enemas, douches, or sexual intercourse for 6 weeks.  Also avoid tub baths, hot tubs, or swimming for 6 weeks.

## 2015-06-22 NOTE — Progress Notes (Signed)
D/C order from MD.  Reviewed d/c instructions and prescriptions with patient and answered any questions.  Patient d/c home with infant via wheelchair by nursing/auxillary. 

## 2015-07-04 ENCOUNTER — Ambulatory Visit
Admission: RE | Admit: 2015-07-04 | Discharge: 2015-07-04 | Disposition: A | Discharge status: Home / Self Care | Payer: BLUE CROSS/BLUE SHIELD | Source: Ambulatory Visit | Attending: Obstetrics and Gynecology | Admitting: Obstetrics and Gynecology

## 2015-07-04 DIAGNOSIS — Z029 Encounter for administrative examinations, unspecified: Secondary | ICD-10-CM | POA: Diagnosis present

## 2015-07-04 NOTE — Lactation Note (Addendum)
Lactation Consultation Note  Patient Name: VONITA CALLOWAY BJYNW'G Date: 07/04/2015     Maternal Data  Mom has been having sore nipples during feedings since "Aubrey's" DOB: [birth year]20[birth year]. She assumed that it was her baby not opening her mouth wide enough to latch on correctly. 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  Feeding  [place] was cueing before being placed on the scale for a pre/post weight. She is up 2lbs from her birth weight in just 2 weeks and is voiding and stooling appropriately. Baby took 50mL during 20 min feeding.   LATCH Score/Interventions  Baby was not fed skin to skin    Mom was able to position baby correctly and latch "[place]" on   "[place]" was able to sustain her latch during the feeding only taking small breaks inbetween swallows which were consistent.   No tools were used for this feeding (comfort gels for afterwards)   Mom's nipples were same size, shape, and color when "[place]" came off. Her breasts are soft/tender, but her nipples are still a bit sore due to the incorrect latching on over the past few weeks.        Lactation Tools Discussed/Used  Mom may use expressed breastmilk on her nipples or comfort gels to combat soreness.   Consult Status  Instructed mom to continue nursing "[place]" when she is showing signs of hunger. "[place]" is curling in her bottom lip as soon as she latches on and during feeds. Taught mom how to hold baby in one hand and use her other one to pull baby's chin down, in order for her bottom lip to uncurl. Showed both mom and dad what baby's lips are supposed to look like during a feeding session. Told mom to call back if problems or pain persist. Also told mom about M.E. Support group.      Burnadette Peter 07/04/2015, 3:43 PM

## 2015-07-05 NOTE — Op Note (Signed)
Krista [name], Krista [name] NO.:  [id number]  MEDICAL RECORD NO.:  [id number]  LOCATION:                                 FACILITY:  PHYSICIAN:  Jennell Corner, MDDATE OF BIRTH:  1981/02/22  DATE OF PROCEDURE: DATE OF DISCHARGE:                              OPERATIVE REPORT   Please reference previously dictated third-degree repair.  ADDENDUM:  Third-degree laceration involved both the internal and external anal sphincter muscles, total transection of these muscles 100%.  The length of the laceration estimated at 3 cm.  There was a multiple layered closure that was performed to reapproximate the sphincter muscles together and to close the overlying tissue above this.          ______________________________ Jennell Corner, MD     TS/MEDQ  D:  07/03/2015  T:  07/03/2015  Job:  [id number]

## 2016-02-06 ENCOUNTER — Other Ambulatory Visit: Payer: Self-pay | Admitting: Obstetrics and Gynecology

## 2016-02-06 DIAGNOSIS — N6452 Nipple discharge: Secondary | ICD-10-CM

## 2016-02-06 DIAGNOSIS — N644 Mastodynia: Secondary | ICD-10-CM

## 2016-02-14 ENCOUNTER — Ambulatory Visit: Payer: BLUE CROSS/BLUE SHIELD

## 2016-02-14 ENCOUNTER — Other Ambulatory Visit: Payer: Self-pay | Admitting: Obstetrics and Gynecology

## 2016-02-14 ENCOUNTER — Ambulatory Visit
Admission: RE | Admit: 2016-02-14 | Discharge: 2016-02-14 | Disposition: A | Discharge status: Home / Self Care | Payer: BLUE CROSS/BLUE SHIELD | Source: Ambulatory Visit | Attending: Obstetrics and Gynecology | Admitting: Obstetrics and Gynecology

## 2016-02-14 DIAGNOSIS — N644 Mastodynia: Secondary | ICD-10-CM | POA: Diagnosis present

## 2016-02-14 DIAGNOSIS — N6452 Nipple discharge: Secondary | ICD-10-CM

## 2018-04-08 ENCOUNTER — Ambulatory Visit: Payer: BLUE CROSS/BLUE SHIELD | Attending: Obstetrics and Gynecology

## 2018-04-08 ENCOUNTER — Other Ambulatory Visit: Payer: Self-pay

## 2018-04-08 DIAGNOSIS — M62838 Other muscle spasm: Secondary | ICD-10-CM

## 2018-04-08 DIAGNOSIS — M791 Myalgia, unspecified site: Secondary | ICD-10-CM

## 2018-04-08 DIAGNOSIS — M533 Sacrococcygeal disorders, not elsewhere classified: Secondary | ICD-10-CM | POA: Insufficient documentation

## 2018-04-08 NOTE — Patient Instructions (Addendum)
Stabilization: Diaphragmatic Breathing    Lie with knees bent, feet flat. Place one hand on stomach, other on chest. Breathe deeply through nose, lifting belly hand without any motion of hand on chest. 5 min. Per night and throughout the day.   Child's Pose Pelvic Floor Lengthening    Sit in knee-chest position and reach arms forward. Separate knees for comfort. Hold position for _5__ breaths. Repeat _2-3__ times. Do _1-2__ times per day.   Take 5 deep breaths, allowing gravity to gently pull you into a deeper stretch with each breath. Repeat 2-3 times, once a day.   Self Posterior Fourchette Stretching/Mobilization    1) Wash your hands and prop your body up so you can easily reach the vagina, bring hand-held mirror if desired.  2) Apply lubricant to the thumb and vaginal opening  3) Place thumb ~ 1/2 an inch into the vagina with the pad of the thumb pointed down and apply gentle pressure to the posterior fourchette.  4) Gently sweep the thumb side to side and in/out while maintaining pressure down toward the anus. Make sure the pressure is not so great that your muscles tighten up and guard, just enough to create slight discomfort.  Do this for ~ 3 min. Per night to decrease tightness and tenderness at the vaginal opening.
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
## 2018-04-15 ENCOUNTER — Ambulatory Visit: Payer: BLUE CROSS/BLUE SHIELD

## 2018-04-15 DIAGNOSIS — M62838 Other muscle spasm: Secondary | ICD-10-CM

## 2018-04-15 DIAGNOSIS — M791 Myalgia, unspecified site: Secondary | ICD-10-CM

## 2018-04-15 DIAGNOSIS — M533 Sacrococcygeal disorders, not elsewhere classified: Secondary | ICD-10-CM

## 2018-04-15 NOTE — Patient Instructions (Signed)
Pelvic Rotation: Contract / Relax (Supine)  MET to Correct Right Anteriorly Rotated/Left Posteriorly Rotated Innominate   Begin laying on your back with your feet at 90 degrees. Put a dowel/broomstick  through your legs, behind your right knee and in front of your left knee. Stabilize the dowel on ether side with your hands.  Press down with the right leg and up with the left leg. Hold for 5 seconds  then slowly relax. Repeat 5 times.    Flexors, Lunge  Hip Flexor Stretch: Proposal Pose    Maintain pelvic tuck under, lift pubic bone toward navel. Engage posterior hip muscles (firm glute muscles of leg in back position) and shift forward until you feel stretch on front of leg that is down. To increase stretch, maintain balance and ease hips forward. You may use one hand on a chair for balance if needed. Hold for __5__ breaths. Repeat __2-3__ times each leg.  Do _1-2__ times per day.
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
## 2018-04-21 ENCOUNTER — Ambulatory Visit: Payer: BLUE CROSS/BLUE SHIELD | Attending: Obstetrics and Gynecology

## 2018-04-21 DIAGNOSIS — M791 Myalgia, unspecified site: Secondary | ICD-10-CM | POA: Insufficient documentation

## 2018-04-21 DIAGNOSIS — M62838 Other muscle spasm: Secondary | ICD-10-CM | POA: Diagnosis present

## 2018-04-21 DIAGNOSIS — M533 Sacrococcygeal disorders, not elsewhere classified: Secondary | ICD-10-CM
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
## 2018-04-21 NOTE — Patient Instructions (Signed)
   Hold for 30 seconds (5 deep breaths) and repeat 2-3 times on each side once a day   * Try to avoid single-leg activities with your exercise, if in doubt plank it out.

## 2018-04-29 ENCOUNTER — Ambulatory Visit: Payer: BLUE CROSS/BLUE SHIELD

## 2018-04-29 DIAGNOSIS — M533 Sacrococcygeal disorders, not elsewhere classified: Secondary | ICD-10-CM

## 2018-04-29 DIAGNOSIS — M791 Myalgia, unspecified site: Secondary | ICD-10-CM

## 2018-04-29 DIAGNOSIS — M62838 Other muscle spasm: Secondary | ICD-10-CM
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
## 2018-05-13 ENCOUNTER — Ambulatory Visit: Payer: BLUE CROSS/BLUE SHIELD

## 2018-05-20 ENCOUNTER — Ambulatory Visit: Payer: BLUE CROSS/BLUE SHIELD | Attending: Obstetrics and Gynecology

## 2018-05-20 DIAGNOSIS — M791 Myalgia, unspecified site: Secondary | ICD-10-CM | POA: Diagnosis present

## 2018-05-20 DIAGNOSIS — M62838 Other muscle spasm: Secondary | ICD-10-CM | POA: Insufficient documentation

## 2018-05-20 DIAGNOSIS — M533 Sacrococcygeal disorders, not elsewhere classified: Secondary | ICD-10-CM | POA: Diagnosis present
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
## 2018-05-27 ENCOUNTER — Ambulatory Visit: Payer: BLUE CROSS/BLUE SHIELD

## 2018-05-27 DIAGNOSIS — M533 Sacrococcygeal disorders, not elsewhere classified: Secondary | ICD-10-CM

## 2018-05-27 DIAGNOSIS — M62838 Other muscle spasm: Secondary | ICD-10-CM

## 2018-05-27 DIAGNOSIS — M791 Myalgia, unspecified site: Secondary | ICD-10-CM

## 2018-05-27 NOTE — Patient Instructions (Signed)
  *   for 3-5 min. Per night, or until you do not feel any burning/pulling, do a gentle skin pull along your c-section scar to help decrease any tension acting on the nerves to the pelvis.
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
## 2018-06-03 ENCOUNTER — Ambulatory Visit: Payer: BLUE CROSS/BLUE SHIELD

## 2018-06-03 DIAGNOSIS — M62838 Other muscle spasm: Secondary | ICD-10-CM | POA: Diagnosis not present

## 2018-06-03 DIAGNOSIS — M791 Myalgia, unspecified site: Secondary | ICD-10-CM

## 2018-06-03 DIAGNOSIS — M533 Sacrococcygeal disorders, not elsewhere classified: Secondary | ICD-10-CM

## 2018-06-03 NOTE — Patient Instructions (Signed)
Female version: Dr. Alonna MiniumJoel Kaplan Premium Prostate Massager
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
## 2018-06-11 ENCOUNTER — Ambulatory Visit: Payer: BLUE CROSS/BLUE SHIELD

## 2018-06-24 ENCOUNTER — Ambulatory Visit: Payer: BLUE CROSS/BLUE SHIELD | Attending: Obstetrics and Gynecology

## 2018-06-24 DIAGNOSIS — M533 Sacrococcygeal disorders, not elsewhere classified: Secondary | ICD-10-CM | POA: Diagnosis present

## 2018-06-24 DIAGNOSIS — M791 Myalgia, unspecified site: Secondary | ICD-10-CM | POA: Insufficient documentation

## 2018-06-24 DIAGNOSIS — M62838 Other muscle spasm: Secondary | ICD-10-CM | POA: Insufficient documentation
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
## 2018-06-24 NOTE — Addendum Note (Signed)
Addended by: Flora LippsGAILES, KEELI T on: 06/24/2018 07:01 PM   Modules accepted: Orders

## 2018-06-24 NOTE — Patient Instructions (Addendum)
 *  Start with bottom leg straight and you can leave your top arm resting on your abdomen rather than reaching forward. (different from what is pictured) Exhale as you rotate, keeping the shoulder down away from your ear. Inhale as you come back forward.

## 2018-07-08 ENCOUNTER — Ambulatory Visit: Payer: BLUE CROSS/BLUE SHIELD

## 2018-07-08 DIAGNOSIS — M791 Myalgia, unspecified site: Secondary | ICD-10-CM

## 2018-07-08 DIAGNOSIS — M533 Sacrococcygeal disorders, not elsewhere classified: Secondary | ICD-10-CM

## 2018-07-08 DIAGNOSIS — M62838 Other muscle spasm: Secondary | ICD-10-CM
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
## 2018-07-15 ENCOUNTER — Ambulatory Visit: Payer: BLUE CROSS/BLUE SHIELD

## 2018-07-22 ENCOUNTER — Ambulatory Visit: Payer: BLUE CROSS/BLUE SHIELD | Attending: Obstetrics and Gynecology

## 2018-07-22 DIAGNOSIS — M791 Myalgia, unspecified site: Secondary | ICD-10-CM | POA: Diagnosis present

## 2018-07-22 DIAGNOSIS — M533 Sacrococcygeal disorders, not elsewhere classified: Secondary | ICD-10-CM | POA: Insufficient documentation

## 2018-07-22 DIAGNOSIS — M62838 Other muscle spasm: Secondary | ICD-10-CM | POA: Diagnosis present
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
## 2018-07-22 NOTE — Patient Instructions (Signed)
   Hold for 30 sec. Repeat 3 times, 1 time per day. Work up to longer duration holds as your endurance improves.

## 2018-07-29 ENCOUNTER — Ambulatory Visit: Payer: BLUE CROSS/BLUE SHIELD

## 2018-07-29 DIAGNOSIS — M62838 Other muscle spasm: Secondary | ICD-10-CM

## 2018-07-29 DIAGNOSIS — M533 Sacrococcygeal disorders, not elsewhere classified: Secondary | ICD-10-CM

## 2018-07-29 DIAGNOSIS — M791 Myalgia, unspecified site: Secondary | ICD-10-CM
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
## 2018-07-29 NOTE — Patient Instructions (Addendum)
When running, think like you are in a controlled fall, bringing the knee forward to propel yourself rather than pushing off through your calf. Minimize "bobbing" up and down and take short "shuffling" steps (find music at 180 bpm if you can to help get the cadence). MAINTAIN FORWARD LEAN ~15-20 deg ish (the pint where you have to take a step) ears in line with your torso in lean)   Access Code: [email or web address]  URL: [email or web address]  Date: 07/29/2018  Prepared by: Flora Lipps   Exercises  Side Stepping with Resistance at Thighs - 10 reps - 3 sets - 1x daily - 7x weekly  Squat with Chair Touch - 10 reps - 3 sets - 1x daily - 7x weekly  Full Plank on Knees - 3 reps - 30 hold - 1x daily - 7x weekly  Tall Kneeling Hip Hinge - 10 reps - 3 sets - 1x daily - 7x weekly  Diaphragmatic Breathing in Child's Pose with Pelvic Floor Relaxation - 3 reps - 5 breaths hold - 1x daily - 7x weekly  Quadruped Adductor Stretch - 3 reps - 5 breaths hold - 1x daily - 7x weekly  Half Kneeling Hip Flexor Stretch - 3 reps - 5 breaths hold - 1x daily - 7x weekly  Sidelying Thoracic and Shoulder Rotation - 10 reps - 3 sets - 1x daily - 7x weekly

## 2018-08-05 ENCOUNTER — Ambulatory Visit: Payer: BLUE CROSS/BLUE SHIELD

## 2018-08-05 DIAGNOSIS — M62838 Other muscle spasm: Secondary | ICD-10-CM | POA: Diagnosis not present

## 2018-08-05 DIAGNOSIS — M791 Myalgia, unspecified site: Secondary | ICD-10-CM

## 2018-08-05 DIAGNOSIS — M533 Sacrococcygeal disorders, not elsewhere classified: Secondary | ICD-10-CM
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
## 2018-08-12 ENCOUNTER — Ambulatory Visit: Payer: BLUE CROSS/BLUE SHIELD

## 2018-08-19 ENCOUNTER — Ambulatory Visit: Payer: BLUE CROSS/BLUE SHIELD | Attending: Obstetrics and Gynecology | Admitting: Physical Therapy

## 2018-08-19 DIAGNOSIS — M62838 Other muscle spasm: Secondary | ICD-10-CM | POA: Insufficient documentation

## 2018-08-19 DIAGNOSIS — M791 Myalgia, unspecified site: Secondary | ICD-10-CM | POA: Insufficient documentation

## 2018-08-19 DIAGNOSIS — M533 Sacrococcygeal disorders, not elsewhere classified: Secondary | ICD-10-CM
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
## 2018-08-19 NOTE — Patient Instructions (Signed)
Glute Strengthening Throughout the Day  1. Laundry/dishwasher/tidying up: using the hip hinge and squat as options. Focus on pressing whole foot into the floor and squeezing the glutes to come to standing with an exhale.   2. Standing at a counter:   A. Use side steps as opposed to reaching with upper body only to engage the glutes.   B. If you are standing still, use kickbacks to engage the glutes while you work on Sports administrator task.  3. Sitting tasks:  A. Use glutes to sit/stand a few more times than required.   B. Consider holding a squat just above the chair while you perform the task for short durations (15-30 sec).  --------------------------------  5 minute Yoga Practice  1.  Cat/Cow   2. Child's Pose- Knees Apart (Frog Pose)  3. Happy Baby  If holding on with hands is too intense, use a belt/strap/leash/bedsheet to support legs in air with less downward force.

## 2018-09-02 ENCOUNTER — Ambulatory Visit: Payer: BLUE CROSS/BLUE SHIELD

## 2019-06-18 NOTE — L&D Delivery Note (Signed)
Delivery Note  First Stage: Labor onset: 1500 Augmentation : AROM Analgesia/Anesthesia intrapartum: epidural AROM at 1912  Second Stage: Complete dilation at 1943 Onset of pushing at 1943  FHR second stage Cat II tracing with terminal bradycardia to 80bpm during pushing. Fetal head at +3 station. Dr Feliberto Gottron notified by [facility] about procedure. Counseled pt on vacuum assisted delivery including risks vs benefits and alternatives  Including cesearean. Pt and spouse verbally consented to vacuum assist. Pts bladder was empty with foley cath removal less than prior, position of fetal head was confirmed in ROA presentation, flat kiwi applied and maternal tissue noted to be free of cup. With next push, vacuum pressure increased to green and one pull was completed while maintaining the fetal head in flexed attitude to crowning. Vacuum removed and maternal expulsive efforts with remaining contraction pushed to delivery.   Delivery of a viable female infant on 03/06/20 at 1953 by CNM delivery of fetal head in ROA position with restitution to ROT. Tight nuchal cord x 1;  Anterior then posterior shoulders delivered easily with gentle downward traction and delivered through cord. Baby placed on mom's chest, and attended to by peds. No markings noted on fetal scalp at delivery.  Cord double clamped after cessation of pulsation, cut by FOB.  Cord blood sample collected   Third Stage: Placenta delivered spontaneously intact with 3VC @ 1959 Placenta disposition: routine disposal Uterine tone Firm / bleeding scant  2nd vaginal/perineal laceration identified  Anesthesia for repair: epidural Repair 2-0 Vicryl CT-1 x 2 Est. Blood Loss (mL):  Complications: terminal bradycardia, prior CS  Mom to postpartum.  Baby to SCN for transition, requiring supplemental O2.   Newborn: Birth Weight: pending  Apgar Scores: pending Feeding planned: breast

## 2019-08-25 LAB — OB RESULTS CONSOLE HIV ANTIBODY (ROUTINE TESTING): HIV: NONREACTIVE

## 2019-08-25 LAB — OB RESULTS CONSOLE HEPATITIS B SURFACE ANTIGEN: Hepatitis B Surface Ag: NEGATIVE

## 2019-08-25 LAB — OB RESULTS CONSOLE RUBELLA ANTIBODY, IGM: Rubella: IMMUNE

## 2019-08-25 LAB — OB RESULTS CONSOLE VARICELLA ZOSTER ANTIBODY, IGG: Varicella: IMMUNE

## 2019-11-11 ENCOUNTER — Ambulatory Visit: Payer: BLUE CROSS/BLUE SHIELD | Admitting: Physical Therapy

## 2020-02-15 LAB — OB RESULTS CONSOLE GC/CHLAMYDIA
Chlamydia: NEGATIVE
Gonorrhea: NEGATIVE

## 2020-02-15 LAB — OB RESULTS CONSOLE RPR: RPR: NONREACTIVE

## 2020-03-06 ENCOUNTER — Other Ambulatory Visit: Payer: Self-pay

## 2020-03-06 ENCOUNTER — Encounter: Payer: Self-pay | Admitting: Obstetrics and Gynecology

## 2020-03-06 ENCOUNTER — Inpatient Hospital Stay: Payer: BC Managed Care – PPO | Admitting: Anesthesiology

## 2020-03-06 ENCOUNTER — Inpatient Hospital Stay
Admission: EM | Admit: 2020-03-06 | Discharge: 2020-03-08 | DRG: 807 | Disposition: A | Discharge status: Home / Self Care | Payer: BC Managed Care – PPO

## 2020-03-06 ENCOUNTER — Other Ambulatory Visit: Payer: Self-pay | Admitting: Obstetrics and Gynecology

## 2020-03-06 DIAGNOSIS — Z20822 Contact with and (suspected) exposure to covid-19: Secondary | ICD-10-CM | POA: Diagnosis present

## 2020-03-06 DIAGNOSIS — O26893 Other specified pregnancy related conditions, third trimester: Secondary | ICD-10-CM | POA: Diagnosis present

## 2020-03-06 DIAGNOSIS — O34211 Maternal care for low transverse scar from previous cesarean delivery: Secondary | ICD-10-CM | POA: Diagnosis present

## 2020-03-06 DIAGNOSIS — N83202 Unspecified ovarian cyst, left side: Secondary | ICD-10-CM | POA: Diagnosis present

## 2020-03-06 DIAGNOSIS — Z3A39 39 weeks gestation of pregnancy: Secondary | ICD-10-CM | POA: Diagnosis not present

## 2020-03-06 DIAGNOSIS — O3483 Maternal care for other abnormalities of pelvic organs, third trimester: Secondary | ICD-10-CM | POA: Diagnosis present

## 2020-03-06 DIAGNOSIS — N83201 Unspecified ovarian cyst, right side: Secondary | ICD-10-CM | POA: Diagnosis present

## 2020-03-06 HISTORY — DX: Headache, unspecified: R51.9

## 2020-03-06 LAB — CBC
HCT: 33.2 % — ABNORMAL LOW (ref 36.0–46.0)
Hemoglobin: 11.3 g/dL — ABNORMAL LOW (ref 12.0–15.0)
MCH: 29.6 pg (ref 26.0–34.0)
MCHC: 34 g/dL (ref 30.0–36.0)
MCV: 86.9 fL (ref 80.0–100.0)
Platelets: 191 10*3/uL (ref 150–400)
RBC: 3.82 MIL/uL — ABNORMAL LOW (ref 3.87–5.11)
RDW: 14.7 % (ref 11.5–15.5)
WBC: 8.6 10*3/uL (ref 4.0–10.5)
nRBC: 0 % (ref 0.0–0.2)

## 2020-03-06 LAB — PROTEIN / CREATININE RATIO, URINE
Creatinine, Urine: 197 mg/dL
Protein Creatinine Ratio: 0.14 mg/mg{Cre} (ref 0.00–0.15)
Total Protein, Urine: 27 mg/dL

## 2020-03-06 LAB — COMPREHENSIVE METABOLIC PANEL
ALT: 37 U/L (ref 0–44)
AST: 49 U/L — ABNORMAL HIGH (ref 15–41)
Albumin: 2.9 g/dL — ABNORMAL LOW (ref 3.5–5.0)
Alkaline Phosphatase: 177 U/L — ABNORMAL HIGH (ref 38–126)
Anion gap: 8 (ref 5–15)
BUN: 14 mg/dL (ref 6–20)
CO2: 24 mmol/L (ref 22–32)
Calcium: 8.8 mg/dL — ABNORMAL LOW (ref 8.9–10.3)
Chloride: 103 mmol/L (ref 98–111)
Creatinine, Ser: 1.03 mg/dL — ABNORMAL HIGH (ref 0.44–1.00)
GFR calc Af Amer: >60 mL/min (ref >60)
GFR calc non Af Amer: >60 mL/min (ref >60)
Glucose, Bld: 102 mg/dL — ABNORMAL HIGH (ref 70–99)
Potassium: 3.7 mmol/L (ref 3.5–5.1)
Sodium: 135 mmol/L (ref 135–145)
Total Bilirubin: 0.7 mg/dL (ref 0.3–1.2)
Total Protein: 7.1 g/dL (ref 6.5–8.1)

## 2020-03-06 LAB — TYPE AND SCREEN
ABO/RH(D): O POS
Antibody Screen: NEGATIVE

## 2020-03-06 LAB — SARS CORONAVIRUS 2 BY RT PCR (HOSPITAL ORDER, PERFORMED IN ~~LOC~~ HOSPITAL LAB): SARS Coronavirus 2: NEGATIVE

## 2020-03-06 LAB — OB RESULTS CONSOLE GBS: GBS: POSITIVE

## 2020-03-06 MED ORDER — FENTANYL CITRATE (PF) 100 MCG/2ML IJ SOLN: 50.0000 ug | INTRAMUSCULAR | Status: DC | PRN

## 2020-03-06 MED ORDER — PRENATAL MULTIVITAMIN CH
1.0000 | ORAL_TABLET | Freq: Every day | ORAL | Status: DC
  Administered 2020-03-07 – 2020-03-08 (×2): 1 via ORAL
  Filled 2020-03-06 (×2): qty 1

## 2020-03-06 MED ORDER — DIBUCAINE (PERIANAL) 1 % EX OINT
1.0000 "application " | TOPICAL_OINTMENT | CUTANEOUS | Status: DC | PRN
  Filled 2020-03-06: qty 28

## 2020-03-06 MED ORDER — SODIUM CHLORIDE 0.9 % IV SOLN
2.0000 g | Freq: Once | INTRAVENOUS | Status: AC
  Administered 2020-03-06: 2 g via INTRAVENOUS
  Filled 2020-03-06: qty 2000

## 2020-03-06 MED ORDER — LACTATED RINGERS IV SOLN: 500.0000 mL | INTRAVENOUS | Status: DC | PRN

## 2020-03-06 MED ORDER — MISOPROSTOL 200 MCG PO TABS
ORAL_TABLET | ORAL | Status: AC
  Filled 2020-03-06: qty 4

## 2020-03-06 MED ORDER — FENTANYL 2.5 MCG/ML W/ROPIVACAINE 0.15% IN NS 100 ML EPIDURAL (ARMC)
EPIDURAL | Status: AC
  Filled 2020-03-06: qty 100

## 2020-03-06 MED ORDER — FENTANYL 2.5 MCG/ML W/ROPIVACAINE 0.15% IN NS 100 ML EPIDURAL (ARMC)
EPIDURAL | Status: DC | PRN
Start: 2020-03-06 — End: 2020-03-06
  Administered 2020-03-06: 12 mL/h via EPIDURAL

## 2020-03-06 MED ORDER — DOCUSATE SODIUM 100 MG PO CAPS
100.0000 mg | ORAL_CAPSULE | Freq: Two times a day (BID) | ORAL | Status: DC
  Administered 2020-03-07 – 2020-03-08 (×3): 100 mg via ORAL
  Filled 2020-03-06 (×3): qty 1

## 2020-03-06 MED ORDER — DIPHENHYDRAMINE HCL 25 MG PO CAPS: 25.0000 mg | ORAL_CAPSULE | Freq: Four times a day (QID) | ORAL | Status: DC | PRN

## 2020-03-06 MED ORDER — LIDOCAINE HCL (PF) 1 % IJ SOLN
30.0000 mL | INTRAMUSCULAR | Status: DC | PRN
  Filled 2020-03-06: qty 30

## 2020-03-06 MED ORDER — COCONUT OIL OIL
1.0000 "application " | TOPICAL_OIL | Status: DC | PRN
  Filled 2020-03-06: qty 120

## 2020-03-06 MED ORDER — ACETAMINOPHEN 325 MG PO TABS: 650.0000 mg | ORAL_TABLET | ORAL | Status: DC | PRN

## 2020-03-06 MED ORDER — TERBUTALINE SULFATE 1 MG/ML IJ SOLN: 0.2500 mg | Freq: Once | INTRAMUSCULAR | Status: DC | PRN

## 2020-03-06 MED ORDER — SIMETHICONE 80 MG PO CHEW
80.0000 mg | CHEWABLE_TABLET | ORAL | Status: DC | PRN
  Administered 2020-03-07: 80 mg via ORAL
  Filled 2020-03-06: qty 1

## 2020-03-06 MED ORDER — WITCH HAZEL-GLYCERIN EX PADS
1.0000 "application " | MEDICATED_PAD | CUTANEOUS | Status: DC
  Filled 2020-03-06: qty 100

## 2020-03-06 MED ORDER — LIDOCAINE-EPINEPHRINE (PF) 1.5 %-1:200000 IJ SOLN
INTRAMUSCULAR | Status: DC | PRN
  Administered 2020-03-06: 3 mL via EPIDURAL

## 2020-03-06 MED ORDER — OXYTOCIN-SODIUM CHLORIDE 30-0.9 UT/500ML-% IV SOLN
2.5000 [IU]/h | INTRAVENOUS | Status: DC
  Administered 2020-03-07: 2.5 [IU]/h via INTRAVENOUS
  Filled 2020-03-06: qty 500

## 2020-03-06 MED ORDER — OXYTOCIN 10 UNIT/ML IJ SOLN
INTRAMUSCULAR | Status: AC
  Filled 2020-03-06: qty 2

## 2020-03-06 MED ORDER — LIDOCAINE HCL (PF) 1 % IJ SOLN
INTRAMUSCULAR | Status: DC | PRN
  Administered 2020-03-06 (×2): 2 mL via SUBCUTANEOUS

## 2020-03-06 MED ORDER — LACTATED RINGERS IV SOLN: INTRAVENOUS | Status: DC

## 2020-03-06 MED ORDER — ONDANSETRON HCL 4 MG/2ML IJ SOLN: 4.0000 mg | INTRAMUSCULAR | Status: DC | PRN

## 2020-03-06 MED ORDER — OXYTOCIN BOLUS FROM INFUSION
333.0000 mL | Freq: Once | INTRAVENOUS | Status: AC
  Administered 2020-03-06: 333 mL via INTRAVENOUS

## 2020-03-06 MED ORDER — SOD CITRATE-CITRIC ACID 500-334 MG/5ML PO SOLN: 30.0000 mL | ORAL | Status: DC | PRN

## 2020-03-06 MED ORDER — ACETAMINOPHEN 500 MG PO TABS
1000.0000 mg | ORAL_TABLET | Freq: Four times a day (QID) | ORAL | Status: DC | PRN
  Administered 2020-03-06 – 2020-03-08 (×7): 1000 mg via ORAL
  Filled 2020-03-06 (×7): qty 2

## 2020-03-06 MED ORDER — VENLAFAXINE HCL ER 150 MG PO CP24
150.0000 mg | ORAL_CAPSULE | Freq: Every day | ORAL | Status: DC
  Administered 2020-03-08: 150 mg via ORAL
  Filled 2020-03-06 (×3): qty 1

## 2020-03-06 MED ORDER — AMMONIA AROMATIC IN INHA
RESPIRATORY_TRACT | Status: AC
  Filled 2020-03-06: qty 10

## 2020-03-06 MED ORDER — BENZOCAINE-MENTHOL 20-0.5 % EX AERO
1.0000 "application " | INHALATION_SPRAY | CUTANEOUS | Status: DC | PRN
  Administered 2020-03-07: 1 via TOPICAL
  Filled 2020-03-06: qty 56

## 2020-03-06 MED ORDER — OXYTOCIN-SODIUM CHLORIDE 30-0.9 UT/500ML-% IV SOLN: 1.0000 m[IU]/min | INTRAVENOUS | Status: DC

## 2020-03-06 MED ORDER — ONDANSETRON HCL 4 MG PO TABS
4.0000 mg | ORAL_TABLET | ORAL | Status: DC | PRN
  Filled 2020-03-06: qty 1

## 2020-03-06 MED ORDER — ONDANSETRON HCL 4 MG/2ML IJ SOLN: 4.0000 mg | Freq: Four times a day (QID) | INTRAMUSCULAR | Status: DC | PRN

## 2020-03-06 MED ORDER — SODIUM CHLORIDE 0.9 % IV SOLN
INTRAVENOUS | Status: DC | PRN
  Administered 2020-03-06 (×2): 5 mL via EPIDURAL

## 2020-03-06 NOTE — Anesthesia Preprocedure Evaluation (Signed)
Anesthesia Evaluation  Patient identified by MRN, date of birth, ID band Patient awake    Reviewed: Allergy & Precautions, H&P , NPO status , Patient's Chart, lab work & pertinent test results, reviewed documented beta blocker date and time   History of Anesthesia Complications Negative for: history of anesthetic complications  Airway Mallampati: III  TM Distance: >3 FB Neck ROM: full    Dental no notable dental hx. (+) Teeth Intact   Pulmonary neg pulmonary ROS,    Pulmonary exam normal breath sounds clear to auscultation       Cardiovascular Exercise Tolerance: Good negative cardio ROS Normal cardiovascular exam Rhythm:regular Rate:Normal     Neuro/Psych negative neurological ROS  negative psych ROS   GI/Hepatic negative GI ROS, Neg liver ROS,   Endo/Other  negative endocrine ROS  Renal/GU negative Renal ROS  negative genitourinary   Musculoskeletal   Abdominal   Peds  Hematology negative hematology ROS (+)   Anesthesia Other Findings Past Medical History: No date: Headache   Reproductive/Obstetrics (+) Pregnancy                             Anesthesia Physical  Anesthesia Plan  ASA: II  Anesthesia Plan: Epidural   Post-op Pain Management:    Induction:   PONV Risk Score and Plan:   Airway Management Planned:   Additional Equipment:   Intra-op Plan:   Post-operative Plan:   Informed Consent: I have reviewed the patients History and Physical, chart, labs and discussed the procedure including the risks, benefits and alternatives for the proposed anesthesia with the patient or authorized representative who has indicated his/her understanding and acceptance.       Plan Discussed with: CRNA  Anesthesia Plan Comments:         Anesthesia Quick Evaluation

## 2020-03-06 NOTE — Progress Notes (Signed)
Dating: EDD: 03/13/19  by LMP: 06/06/19 and c/w [place] at 9+1 wks.   Preg c/b: 1. Group B strep POS- needs IP prophy 2. Prepregnancy BMI >30  Early glucola - 81  CMP, urine p/c ratio - WNL  3. History of depression   08/25/19 - NOB EPDS 6  Meds: Effexor - reports stable mood  Counseling: declines currently, reviewed resources 4. History of LTCS - followed by VBAC  Primary c/sect for NRFHR, meconium, remote from delivery  VBAC with subsequent birth - 3rd degree laceration   Desires TOLAC [x]  TOLAC counseling by TJS 5. History of 3rd degree laceration with VBAC  Pt did PFPT postpartum which was helpful 6. Bilateral ovarian cysts    10/21/2019: Rt complex septated ovarian cyst=1.8cm; septation=0.15cm, Lt complex ovarian cyst with solid component=2.7cm; solid area=1.26 x 1.14 x 1.21cm 7. Advanced maternal age.   Prenatal Labs: Blood type/Rh O pos  Antibody screen neg  Rubella Immune  Varicella Immune  RPR NR  HBsAg Neg  HIV NR  GC neg  Chlamydia neg  Genetic screening  declined  1 hour GTT 107  3 hour GTT   GBS Pos

## 2020-03-06 NOTE — H&P (Signed)
OB History & Physical   History of Present Illness:  Chief Complaint:  Painful UCs since 3pm  HPI:  Krista [name] is a 39 y.o. G11P1002 female at [redacted]w[redacted]d dated by LMP and c/w [place] at 9+1wks.  She presents to L&D for Painful UCs, off and on all day until 3pm when they became regular and more painful. Denies LOF or VB. Seen in office and noted variable decel to 115bpm with palpated moderate UC. Spontaneous fetal recovery to baseline 155bpm.      Pregnancy Issues: 1. Group B strep POS- needs IP prophy 2. Prepregnancy BMI >30  Early glucola - 81  CMP, urine p/c ratio - WNL  3. History of depression   08/25/19 - NOB EPDS 6  Meds: Effexor - reports stable mood  Counseling: declines currently, reviewed resources 4. History of LTCS - followed by VBAC  Primary c/sect for NRFHR, meconium, remote from delivery  VBAC with subsequent birth - 3rd degree laceration   Desires TOLAC [x]  TOLAC counseling by TJS 5. History of 3rd degree laceration with VBAC  Pt did PFPT postpartum which was helpful 6. Bilateral ovarian cysts    10/21/2019: Rt complex septated ovarian cyst=1.8cm; septation=0.15cm, Lt complex ovarian cyst with solid component=2.7cm; solid area=1.26 x 1.14 x 1.21cm 7. Advanced maternal age.    Maternal Medical History:  No past medical history on file.  Past Surgical History:  Procedure Laterality Date  . CESAREAN SECTION  08/06/2013    Allergies  Allergen Reactions  . Ibuprofen Anaphylaxis  . Naproxen Anaphylaxis    Prior to Admission medications   Medication Sig Start Date End Date Taking? Authorizing Provider  docusate sodium (COLACE) 100 MG capsule Take 1 capsule (100 mg total) by mouth 2 (two) times daily. Patient not taking: Reported on 04/08/2018 06/22/15   Schermerhorn, 08/20/15, MD  levonorgestrel (MIRENA) 20 MCG/24HR IUD 1 each by Intrauterine route once.    [provider]  norgestimate-ethinyl estradiol (ORTHO-CYCLEN,SPRINTEC,PREVIFEM) 0.25-35  MG-MCG tablet Take 1 tablet by mouth daily.    [provider]  oxyCODONE-acetaminophen (PERCOCET/ROXICET) 5-325 MG tablet Take 1 tablet by mouth every 4 (four) hours as needed (for pain scale 4-7). Patient not taking: Reported on 04/08/2018 06/22/15   Schermerhorn, 08/20/15, MD  Prenatal Vit-Fe Fumarate-FA (PRENATAL MULTIVITAMIN) TABS tablet Take 1 tablet by mouth daily at 12 noon.    [provider]  venlafaxine XR (EFFEXOR-XR) 150 MG 24 hr capsule Take 150 mg by mouth daily with breakfast.    [provider]     Pren[facility] care site: [facility]  Social History: She  reports that she has never smoked. She has never used smokeless tobacco. She reports that she does not drink alcohol and does not use drugs.  Family History: no family hx Gyn cancers  Review of Systems: A full review of systems was performed and negative except as noted in the HPI.     Physical Exam:  Vital Signs: There were no vitals taken for this visit. General: no acute distress.  HEENT: normocephalic, atraumatic Heart: regular rate & rhythm.  No murmurs/rubs/gallops Lungs: clear to auscultation bilaterally, normal respiratory effort Abdomen: soft, gravid, non-tender;  EFW: 8lbs Pelvic:   External: Normal external female genitalia  Cervix: 5/90/-1 in office, soft/posterior with BBOW.    Extremities: non-tender, symmetric, 1+ LE edema bilaterally.  DTRs: 2+  Neurologic: Alert & oriented x 3.    No results found for this or any previous visit (from the past 24  hour(s)).  Pertinent Results:  Prenatal Labs: Blood type/Rh  O pos  Antibody screen neg  Rubella Immune  Varicella Immune  RPR NR  HBsAg Neg  HIV NR  GC neg  Chlamydia neg  Genetic screening  declined  1 hour GTT  107  3 hour GTT   GBS  Pos   FHT: 145bpm, moderate variability, no decels TOCO: q5min   Cephalic by leopolds and SVE  No results found.  Assessment:  Krista [name] is a 39 y.o. [phone]  female at 39.1wks with active labor.   Plan:  1. Admit to Labor & Delivery; consents reviewed and obtained -COVID swab on admit.   2. Fetal Well being  - Fetal Tracing: Cat I tracing - Group B Streptococcus ppx indicated: Positive, Ampicillin 2grams IVPB ordered - Presentation: cephalic confirmed by SVE   3. Routine OB: - Prenatal labs reviewed, as above - Rh O Pos - CBC, T&S, RPR on admit - Clear fluids, IVF  4. Monitoring of Labor -  Contractions: external toco in place -  Pelvis proven to 6#15 -  Plan for induction with AROM/Pitocin if needed -  Plan for continuous fetal monitoring  -  Maternal pain control as desired - Anticipate vaginal delivery  5. Post Partum Planning: - Infant feeding: breast - Contraception: IUD - Tdap 02/15/20 - Flu - plans to get via work during PP period.    Randa Ngo, CNM 03/06/20 5:31 PM

## 2020-03-06 NOTE — Progress Notes (Addendum)
Wolfgang Phoenix, MD notified of TOLAC pt at 12. MD called for epidural request at 1823. RN called MD again at 1843 for epidural request. R.McVey, CNM at bedside. Karlton Lemon, MD at bedside for epidural at 1847.

## 2020-03-06 NOTE — Anesthesia Procedure Notes (Signed)
Epidural Patient location during procedure: OB Start time: 03/06/2020 6:55 PM End time: 03/06/2020 6:58 PM  Staffing Anesthesiologist: Lenard Simmer, MD Performed: anesthesiologist   Preanesthetic Checklist Completed: patient identified, IV checked, site marked, risks and benefits discussed, surgical consent, monitors and equipment checked, pre-op evaluation and timeout performed  Epidural Patient position: sitting Prep: ChloraPrep Patient monitoring: heart rate, continuous pulse ox and blood pressure Approach: midline Location: L3-L4 Injection technique: LOR saline  Needle:  Needle type: Tuohy  Needle gauge: 17 G Needle length: 9 cm and 9 Needle insertion depth: 4 cm Catheter type: closed end flexible Catheter size: 19 Gauge Catheter at skin depth: 9 cm Test dose: negative and 1.5% lidocaine with Epi 1:200 K  Assessment Sensory level: T10 Events: blood not aspirated, injection not painful, no injection resistance, no paresthesia and negative IV test  Additional Notes 1st attempt Pt. Evaluated and documentation done after procedure finished. Patient identified. Risks/Benefits/Options discussed with patient including but not limited to bleeding, infection, nerve damage, paralysis, failed block, incomplete pain control, headache, blood pressure changes, nausea, vomiting, reactions to medication both or allergic, itching and postpartum back pain. Confirmed with bedside nurse the patient's most recent platelet count. Confirmed with patient that they are not currently taking any anticoagulation, have any bleeding history or any family history of bleeding disorders. Patient expressed understanding and wished to proceed. All questions were answered. Sterile technique was used throughout the entire procedure. Please see nursing notes for vital signs. Test dose was given through epidural catheter and negative prior to continuing to dose epidural or start infusion. Warning signs of high  block given to the patient including shortness of breath, tingling/numbness in hands, complete motor block, or any concerning symptoms with instructions to call for help. Patient was given instructions on fall risk and not to get out of bed. All questions and concerns addressed with instructions to call with any issues or inadequate analgesia.   Patient tolerated the insertion well without immediate complications.Reason for block:procedure for pain

## 2020-03-06 NOTE — Progress Notes (Signed)
Labor Progress Note  Krista [name] is a 39 y.o. G4 P2012 at [redacted]w[redacted]d by LMP admitted for active labor  Subjective: more painful UCs, requesting epidural  - denies LOF, VD or HA. Has had bilateral LE 1+ edema.   Objective: BP (!) 146/92   Pulse 97   Temp 98.1 F (36.7 C) (Oral)   Resp 16   Ht 4\' 11"  (1.499 m)   Wt 77.6 kg   LMP 06/06/2019   BMI 34.54 kg/m  Notable VS details: reviewed  Fetal Assessment: FHT:  FHR: 145 bpm, variability: moderate,  accelerations:  Present,  decelerations:  Present variable decels, spont recovery.  Category/reactivity:  Category II UC:   regular, every 2 minutes SVE:   8/C/0 with BBOW  Membrane status: intact Amniotic color:  n/a  Labs: Lab Results  Component Value Date   WBC 8.6 03/06/2020   HGB 11.3 (L) 03/06/2020   HCT 33.2 (L) 03/06/2020   MCV 86.9 03/06/2020   PLT 191 03/06/2020    Assessment / Plan: Spontaneous labor, progressing normally  G4P2012 at 39.1 with TOLAC  Labor: Progressing normally Preeclampsia:  Urine P/C pending, AST and creatinine elevated. Mild range BPs.  Fetal Wellbeing:  Category II Pain Control:  pedning anesthesia at bedside.  I/D:  s/p Ampicillin dose x 1.  Anticipated MOD:  VBAC  03/08/2020, CNM 03/06/2020, 6:48 PM       ;a

## 2020-03-06 NOTE — Progress Notes (Signed)
Pt is a G3P1001 [redacted]w[redacted]d. Pt is a previous c/s for fetal intolerance with 1 successful VBAC. Pt consents to a Trial of Labor After Cesarean. OR RN Durel Salts, RN notified at [phone]. Dr. Karlton Lemon, anesthesia notified at 1759. NSS Supervisor notified at 1759.
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
## 2020-03-06 NOTE — Progress Notes (Signed)
Pt sent from [facility] GYN office in active labor.  Pt denies leaking of fluid or vaginal bleeding. Pt reports ctx q4-4min. Orders released, Heloise Ochoa, CNM at bedside.

## 2020-03-06 NOTE — Progress Notes (Signed)
Labor Progress Note  Krista [name] is a 39 y.o. G4 P2012 at [redacted]w[redacted]d by LMP admitted for active labor  Subjective: feeling better after bolus dose in epidural.  - denies LOF, VD or HA. Has had bilateral LE 1+ edema.   Objective: BP (!) 141/80   Pulse 97   Temp 98.1 F (36.7 C) (Oral)   Resp 16   Ht 4\' 11"  (1.499 m)   Wt 77.6 kg   LMP 06/06/2019   BMI 34.54 kg/m  Notable VS details: reviewed  Fetal Assessment: FHT:  FHR: 145 bpm, variability: moderate,  accelerations:  Present,  decelerations:  Present variable decels, spont recovery.  Category/reactivity:  Category II UC:   regular, every 2 minutes SVE:   8/C/0 with BBOW, AROM performed large amt fluid, bloody and light meconium noted.   Membrane status: AROM at 1912 Amniotic color: light meconium with bloody.   Labs: Lab Results  Component Value Date   WBC 8.6 03/06/2020   HGB 11.3 (L) 03/06/2020   HCT 33.2 (L) 03/06/2020   MCV 86.9 03/06/2020   PLT 191 03/06/2020   Urine P/C pending- sent to lab now.   CMP:  Component     Latest Ref Rng & Units 03/06/2020  Sodium     135 - 145 mmol/L 135  Potassium     3.5 - 5.1 mmol/L 3.7  Chloride     98 - 111 mmol/L 103  CO2     22 - 32 mmol/L 24  Glucose     70 - 99 mg/dL 03/08/2020 (H)  BUN     6 - 20 mg/dL 14  Creatinine     960 - 1.00 mg/dL 4.54 (H)  Calcium     8.9 - 10.3 mg/dL 8.8 (L)  Total Protein     6.5 - 8.1 g/dL 7.1  Albumin     3.5 - 5.0 g/dL 2.9 (L)  AST     15 - 41 U/L 49 (H)  ALT     0 - 44 U/L 37  Alkaline Phosphatase     38 - 126 U/L 177 (H)  Total Bilirubin     0.3 - 1.2 mg/dL 0.7  GFR, Est Non African American     >60 mL/min >60  GFR, Est African American     >60 mL/min >60  Anion gap     5 - 15 8    Assessment / Plan: Spontaneous labor, progressing normally  G4P2012 at 39.1 with TOLAC in active labor  Labor: Progressing normally, AROM now.  Preeclampsia:  Urine P/C pending, AST and creatinine elevated. Mild range BPs.  continue to  monitor closely.  Fetal Wellbeing:  Category II Pain Control:  Epidural I/D:  s/p Ampicillin dose x 1.  Anticipated MOD:  VBAC   0.98 Castin Donaghue, CNM 03/06/2020, 7:19 PM       ;a

## 2020-03-07 LAB — COMPREHENSIVE METABOLIC PANEL
ALT: 32 U/L (ref 0–44)
AST: 42 U/L — ABNORMAL HIGH (ref 15–41)
Albumin: 2.3 g/dL — ABNORMAL LOW (ref 3.5–5.0)
Alkaline Phosphatase: 144 U/L — ABNORMAL HIGH (ref 38–126)
Anion gap: 6 (ref 5–15)
BUN: 12 mg/dL (ref 6–20)
CO2: 21 mmol/L — ABNORMAL LOW (ref 22–32)
Calcium: 8.4 mg/dL — ABNORMAL LOW (ref 8.9–10.3)
Chloride: 107 mmol/L (ref 98–111)
Creatinine, Ser: 0.92 mg/dL (ref 0.44–1.00)
GFR calc Af Amer: >60 mL/min (ref >60)
GFR calc non Af Amer: >60 mL/min (ref >60)
Glucose, Bld: 84 mg/dL (ref 70–99)
Potassium: 4.4 mmol/L (ref 3.5–5.1)
Sodium: 134 mmol/L — ABNORMAL LOW (ref 135–145)
Total Bilirubin: 0.6 mg/dL (ref 0.3–1.2)
Total Protein: 5.9 g/dL — ABNORMAL LOW (ref 6.5–8.1)

## 2020-03-07 LAB — CBC
HCT: 32.3 % — ABNORMAL LOW (ref 36.0–46.0)
Hemoglobin: 11.2 g/dL — ABNORMAL LOW (ref 12.0–15.0)
MCH: 29.5 pg (ref 26.0–34.0)
MCHC: 34.7 g/dL (ref 30.0–36.0)
MCV: 85 fL (ref 80.0–100.0)
Platelets: 185 10*3/uL (ref 150–400)
RBC: 3.8 MIL/uL — ABNORMAL LOW (ref 3.87–5.11)
RDW: 14.6 % (ref 11.5–15.5)
WBC: 12.3 10*3/uL — ABNORMAL HIGH (ref 4.0–10.5)
nRBC: 0 % (ref 0.0–0.2)

## 2020-03-07 LAB — RPR: RPR Ser Ql: NONREACTIVE

## 2020-03-07 MED ORDER — NIFEDIPINE ER OSMOTIC RELEASE 30 MG PO TB24
30.0000 mg | ORAL_TABLET | Freq: Every day | ORAL | Status: DC
  Administered 2020-03-07: 30 mg via ORAL
  Filled 2020-03-07: qty 1

## 2020-03-07 NOTE — Progress Notes (Signed)
Post Partum Day 1 Subjective: Doing well, no complaints.  Tolerating regular diet, pain with PO meds, voiding and ambulating without difficulty.  No CP SOB Fever,Chills, N/V or leg pain; denies nipple or breast pain, no HA change of vision, RUQ/epigastric pain  Objective: BP 137/90 (BP Location: Right Arm)   Pulse 65   Temp 98.5 F (36.9 C) (Oral)   Resp 18   Ht 4\' 11"  (1.499 m)   Wt 77.6 kg   LMP 06/06/2019   SpO2 99%   Breastfeeding Unknown   BMI 34.54 kg/m    Physical Exam:  General: NAD Breasts: soft/nontender CV: RRR Pulm: nl effort, CTABL Abdomen: soft, NT, BS x 4 Perineum: minimal edema, repair well approximated Lochia: small Uterine Fundus: fundus firm and 2 fb below umbilicus DVT Evaluation: no cords, ttp LEs   Recent Labs    03/06/20 1743 03/07/20 0400  HGB 11.3* 11.2*  HCT 33.2* 32.3*  WBC 8.6 12.3*  PLT 191 185   Component     Latest Ref Rng & Units 03/07/2020  Sodium     135 - 145 mmol/L 134 (L)  Potassium     3.5 - 5.1 mmol/L 4.4  Chloride     98 - 111 mmol/L 107  CO2     22 - 32 mmol/L 21 (L)  Glucose     70 - 99 mg/dL 84  BUN     6 - 20 mg/dL 12  Creatinine     03/09/2020 - 1.00 mg/dL 3.55  Calcium     8.9 - 10.3 mg/dL 8.4 (L)  Total Protein     6.5 - 8.1 g/dL 5.9 (L)  Albumin     3.5 - 5.0 g/dL 2.3 (L)  AST     15 - 41 U/L 42 (H)  ALT     0 - 44 U/L 32  Alkaline Phosphatase     38 - 126 U/L 144 (H)  Total Bilirubin     0.3 - 1.2 mg/dL 0.6  GFR, Est Non African American     >60 mL/min >60  GFR, Est African American     >60 mL/min >60  Anion gap     5 - 15 6    Assessment/Plan: 39 y.o. 30 postpartum day # 1  - Continue routine PP care, infant in room from SCN now.  - Lactation consult prn  - reviewed elevated BPs, labs repeat this am, will start procardia 30mg  XL PO now. Repeat CMP and CBC in am.  - Immunization status: all Imms up to date    Disposition: Does not desire Dc home today.     [id number],  CNM 03/07/2020  1:44 PM

## 2020-03-07 NOTE — Lactation Note (Signed)
This note was copied from a baby's chart. Lactation Consultation Note  Patient Name: Krista [name] [id number] Date: 03/07/2020 Reason for consult: Initial assessment;Term;NICU baby;Other (Comment) (now rooming-in)  Initial lactation visit. Mom is G3P3, SVD 19 hours ago. Baby had to be in SCN until a short time ago, tachypnea which has resolved. Mom breastfed him for the first time around 12:30, and reports some discomfort/nipple tenderness- no visible sign of damage. In speaking with mom, mom had initial latching difficulties with her 2 other children but was able to go on and successfully breastfeed. LC encouraged mom to continue with putting baby to the breast, as he has been given a bottle and pacifier, he may need some time to adjust. Mom would like LC present at next feeding to assist with position and latch, LC placed name/number on whiteboard.  LC provided mom with comfort gels; educated in use, rinsing in water, and storage. Mom to call out at next feeding.  Maternal Data Formula Feeding for Exclusion: No Has patient been taught Hand Expression?: Yes Does the patient have breastfeeding experience prior to this delivery?: Yes  Feeding Feeding Type: Breast Fed  LATCH Score Latch: Grasps breast easily, tongue down, lips flanged, rhythmical sucking.  Audible Swallowing: Spontaneous and intermittent  Type of Nipple: Everted at rest and after stimulation  Comfort (Breast/Nipple): Soft / non-tender  Hold (Positioning): No assistance needed to correctly position infant at breast.  LATCH Score: 10  Interventions Interventions: Breast feeding basics reviewed;DEBP;Comfort gels  Lactation Tools Discussed/Used Tools: Comfort gels;Pump Breast pump type: Double-Electric Breast Pump   Consult Status Consult Status: Follow-up Date: 03/07/20 Follow-up type: Call as needed    Danford Bad 03/07/2020, 3:22 PM

## 2020-03-07 NOTE — Anesthesia Postprocedure Evaluation (Signed)
Anesthesia Post Note  Patient: Krista [name]  Procedure(s) Performed: AN AD HOC LABOR EPIDURAL  Patient location during evaluation: Mother Baby Anesthesia Type: Epidural Level of consciousness: oriented and awake and alert Pain management: pain level controlled Vital Signs Assessment: post-procedure vital signs reviewed and stable Respiratory status: spontaneous breathing and respiratory function stable Cardiovascular status: blood pressure returned to baseline and stable Postop Assessment: no headache, no backache, no apparent nausea or vomiting and able to ambulate Anesthetic complications: no   No complications documented.   Last Vitals:  Vitals:   03/07/20 0454 03/07/20 0729  BP: 114/78 123/86  Pulse: 74 79  Resp: 18 18  Temp: 36.8 C 37.1 C  SpO2: 99% 98%    Last Pain:  Vitals:   03/07/20 0729  TempSrc: Oral  PainSc:                  Krista [name]

## 2020-03-08 LAB — CBC
HCT: 33.7 % — ABNORMAL LOW (ref 36.0–46.0)
Hemoglobin: 11 g/dL — ABNORMAL LOW (ref 12.0–15.0)
MCH: 28.9 pg (ref 26.0–34.0)
MCHC: 32.6 g/dL (ref 30.0–36.0)
MCV: 88.7 fL (ref 80.0–100.0)
Platelets: 191 10*3/uL (ref 150–400)
RBC: 3.8 MIL/uL — ABNORMAL LOW (ref 3.87–5.11)
RDW: 14.9 % (ref 11.5–15.5)
WBC: 8.4 10*3/uL (ref 4.0–10.5)
nRBC: 0 % (ref 0.0–0.2)

## 2020-03-08 LAB — COMPREHENSIVE METABOLIC PANEL
ALT: 29 U/L (ref 0–44)
AST: 36 U/L (ref 15–41)
Albumin: 2.6 g/dL — ABNORMAL LOW (ref 3.5–5.0)
Alkaline Phosphatase: 129 U/L — ABNORMAL HIGH (ref 38–126)
Anion gap: 4 — ABNORMAL LOW (ref 5–15)
BUN: 11 mg/dL (ref 6–20)
CO2: 27 mmol/L (ref 22–32)
Calcium: 8.3 mg/dL — ABNORMAL LOW (ref 8.9–10.3)
Chloride: 105 mmol/L (ref 98–111)
Creatinine, Ser: 0.85 mg/dL (ref 0.44–1.00)
GFR calc Af Amer: >60 mL/min (ref >60)
GFR calc non Af Amer: >60 mL/min (ref >60)
Glucose, Bld: 74 mg/dL (ref 70–99)
Potassium: 3.8 mmol/L (ref 3.5–5.1)
Sodium: 136 mmol/L (ref 135–145)
Total Bilirubin: 0.4 mg/dL (ref 0.3–1.2)
Total Protein: 6.2 g/dL — ABNORMAL LOW (ref 6.5–8.1)

## 2020-03-08 MED ORDER — NIFEDIPINE ER OSMOTIC RELEASE 30 MG PO TB24
60.0000 mg | ORAL_TABLET | Freq: Every day | ORAL | Status: DC
  Administered 2020-03-08: 60 mg via ORAL
  Filled 2020-03-08: qty 2

## 2020-03-08 MED ORDER — NIFEDIPINE ER 60 MG PO TB24: 60.0000 mg | ORAL_TABLET | Freq: Every day | ORAL | 11 refills | Status: DC

## 2020-03-08 NOTE — Progress Notes (Signed)
D/C Instructions reviewed with Pt. And she V/O. Instructed Pt. To take B/P BID since beginning BP medications and Pt. V/o. 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 D/C to home with sig. Other.

## 2020-03-08 NOTE — Progress Notes (Signed)
Post Partum Day 2  Subjective: Doing well, no concerns. Ambulating without difficulty, pain managed with PO meds, tolerating regular diet, and voiding without difficulty.   No fever/chills, chest pain, shortness of breath, nausea/vomiting, or leg pain. No nipple or breast pain. No headache, visual changes, or RUQ/epigastric pain.  Objective: BP 140/85 (BP Location: Left Arm)   Pulse 63   Temp 98.1 F (36.7 C) (Oral)   Resp 18   Ht 4\' 11"  (1.499 m)   Wt 77.6 kg   LMP 06/06/2019   SpO2 100%   Breastfeeding Unknown   BMI 34.54 kg/m    Edinburgh:  Edinburgh Postnatal Depression Scale Screening Tool 03/07/2020  I have been able to laugh and see the funny side of things. 0  I have looked forward with enjoyment to things. 0  I have blamed myself unnecessarily when things went wrong. 1  I have been anxious or worried for no good reason. 0  I have felt scared or panicky for no good reason. 3  Things have been getting on top of me. 0  I have been so unhappy that I have had difficulty sleeping. 0  I have felt sad or miserable. 0  I have been so unhappy that I have been crying. 0  The thought of harming myself has occurred to me. 0  Edinburgh Postnatal Depression Scale Total 4     Recent Labs    03/07/20 0400 03/08/20 0413  HGB 11.2* 11.0*  HCT 32.3* 33.7*  WBC 12.3* 8.4  PLT 185 191    Assessment/Plan: 39 y.o. G3P2003 postpartum day # 2  -Continue routine postpartum care -Meds to [facility] order set ordered   Disposition: Continue inpatient postpartum care Desires discharge home today   LOS: 2 days   Luisfelipe Engelstad, CNM 03/08/2020, 8:47 AM

## 2020-03-08 NOTE — Lactation Note (Signed)
This note was copied from a baby's chart. Lactation Consultation Note  Patient Name: Krista [name] [id number] Date: 03/08/2020 Reason for consult: Follow-up assessment;Mother's request;Term  Mom called for lactation to observe position/latch of baby today. Mom feeling some initial discomfort with latch and nipple tenderness that does resolve as feeding progresses. Baby had circumcision early this morning, and is finally awake and alert desiring to eat.  Mom independently is able to get baby into good position and alignment with body turned in and nose to nipple. Mom rubbed nipple from nose to chin to encourage a wide mouth, initially baby's bottom lip was tucked in. LC provided pointers for a deeper latch so chin is into the breast tissue; baby re-latched easily and began strong rhythmic sucking with bottom lip appearing more flanged out than the first attempt. LC gently pulled down to flip it out more; mom feeling a different. Encouraged to continue working with baby to get a deeper latch, reviewed how to pull down on chin, and continued use of coconut oil and comfort gels for tenderness.  Reviewed BF basics for the days to come, output expectations, signs of adequate transfer, early feeding cues, and breast/nipple care for mom. Information given for outpatient lactation services and community breastfeeding support.  Maternal Data Formula Feeding for Exclusion: No Has patient been taught Hand Expression?: Yes Does the patient have breastfeeding experience prior to this delivery?: Yes  Feeding Feeding Type: Breast Fed  LATCH Score Latch: Grasps breast easily, tongue down, lips flanged, rhythmical sucking.  Audible Swallowing: Spontaneous and intermittent  Type of Nipple: Everted at rest and after stimulation  Comfort (Breast/Nipple): Soft / non-tender  Hold (Positioning): No assistance needed to correctly position infant at breast.  LATCH Score: 10  Interventions Interventions:  Breast feeding basics reviewed  Lactation Tools Discussed/Used     Consult Status Consult Status: Complete Date: 03/08/20 Follow-up type: Call as needed    Danford Bad 03/08/2020, 1:50 PM

## 2020-03-08 NOTE — Consult Note (Addendum)
Brief Pharmacy Note  Called pt room and offered meds to beds.  Pt accepted meds to beds and patient preferred pharmacy was updated to [facility] employee pharmacy.  Pharmacist collected insurance information from pt and provided it to employee pharmacy to create profile for pt.  Awaiting pt discharge.   Albina Billet, PharmD, BCPS Clinical Pharmacist 03/08/2020 2:35 PM

## 2020-03-08 NOTE — Discharge Instructions (Signed)
Postpartum Hypertension Postpartum hypertension is high blood pressure that remains higher than normal after childbirth. You may not realize that you have postpartum hypertension if your blood pressure is not being checked regularly. In most cases, postpartum hypertension will go away on its own, usually within a week of delivery. However, for some women, medical treatment is required to prevent serious complications, such as seizures or stroke. What are the causes? This condition may be caused by one or more of the following:  Hypertension that existed before pregnancy (chronic hypertension).  Hypertension that comes on as a result of pregnancy (gestational hypertension).  Hypertensive disorders during pregnancy (preeclampsia) or seizures in women who have high blood pressure during pregnancy (eclampsia).  A condition in which the liver, platelets, and red blood cells are damaged during pregnancy (HELLP syndrome).  A condition in which the thyroid produces too much hormones (hyperthyroidism).  Other rare problems of the nerves (neurological disorders) or blood disorders. In some cases, the cause may not be known. What increases the risk? The following factors may make you more likely to develop this condition:  Chronic hypertension. In some cases, this may not have been diagnosed before pregnancy.  Obesity.  Type 2 diabetes.  Kidney disease.  History of preeclampsia or eclampsia.  Other medical conditions that change the level of hormones in the body (hormonal imbalance). What are the signs or symptoms? As with all types of hypertension, postpartum hypertension may not have any symptoms. Depending on how high your blood pressure is, you may experience:  Headaches. These may be mild, moderate, or severe. They may also be steady, constant, or sudden in onset (thunderclap headache).  Changes in your ability to see (visual changes).  Dizziness.  Shortness of breath.  Swelling  of your hands, feet, lower legs, or face. In some cases, you may have swelling in more than one of these locations.  Heart palpitations or a racing heartbeat.  Difficulty breathing while lying down.  Decrease in the amount of urine that you pass. Other rare signs and symptoms may include:  Sweating more than usual. This lasts longer than a few days after delivery.  Chest pain.  Sudden dizziness when you get up from sitting or lying down.  Seizures.  Nausea or vomiting.  Abdominal pain. How is this diagnosed? This condition may be diagnosed based on the results of a physical exam, blood pressure measurements, and blood and urine tests. You may also have other tests, such as a CT scan or an MRI, to check for other problems of postpartum hypertension. How is this treated? If blood pressure is high enough to require treatment, your options may include:  Medicines to reduce blood pressure (antihypertensives). Tell your health care provider if you are breastfeeding or if you plan to breastfeed. There are many antihypertensive medicines that are safe to take while breastfeeding.  Stopping medicines that may be causing hypertension.  Treating medical conditions that are causing hypertension.  Treating the complications of hypertension, such as seizures, stroke, or kidney problems. Your health care provider will also continue to monitor your blood pressure closely until it is within a safe range for you. Follow these instructions at home:  Take over-the-counter and prescription medicines only as told by your health care provider.  Return to your normal activities as told by your health care provider. Ask your health care provider what activities are safe for you.  Do not use any products that contain nicotine or tobacco, such as cigarettes and e-cigarettes. If   you need help quitting, ask your health care provider.  Keep all follow-up visits as told by your health care provider. This  is important. Contact a health care provider if:  Your symptoms get worse.  You have new symptoms, such as: ? A headache that does not get better. ? Dizziness. ? Visual changes. Get help right away if:  You suddenly develop swelling in your hands, ankles, or face.  You have sudden, rapid weight gain.  You develop difficulty breathing, chest pain, racing heartbeat, or heart palpitations.  You develop severe pain in your abdomen.  You have any symptoms of a stroke. "BE FAST" is an easy way to remember the main warning signs of a stroke: ? B - Balance. Signs are dizziness, sudden trouble walking, or loss of balance. ? E - Eyes. Signs are trouble seeing or a sudden change in vision. ? F - Face. Signs are sudden weakness or numbness of the face, or the face or eyelid drooping on one side. ? A - Arms. Signs are weakness or numbness in an arm. This happens suddenly and usually on one side of the body. ? S - Speech. Signs are sudden trouble speaking, slurred speech, or trouble understanding what people say. ? T - Time. Time to call emergency services. Write down what time symptoms started.  You have other signs of a stroke, such as: ? A sudden, severe headache with no known cause. ? Nausea or vomiting. ? Seizure. These symptoms may represent a serious problem that is an emergency. Do not wait to see if the symptoms will go away. Get medical help right away. Call your local emergency services (911 in the [place] Do not drive yourself to the hospital. Summary  Postpartum hypertension is high blood pressure that remains higher than normal after childbirth.  In most cases, postpartum hypertension will go away on its own, usually within a week of delivery.  For some women, medical treatment is required to prevent serious complications, such as seizures or stroke. This information is not intended to replace advice given to you by your health care provider. Make sure you discuss any questions  you have with your health care provider. Document Revised: 07/10/2018 Document Reviewed: 03/24/2017 Elsevier Patient Education  2020 [employer].   Postpartum Baby Blues The postpartum period begins right after the birth of a baby. During this time, there is often a lot of joy and excitement. It is also a time of many changes in the life of the parents. No matter how many times a mother gives birth, each child brings new challenges to the family, including different ways of relating to one another. It is common to have feelings of excitement along with confusing changes in moods, emotions, and thoughts. You may feel happy one minute and sad or stressed the next. These feelings of sadness usually happen in the period right after you have your baby, and they go away within a week or two. This is called the "baby blues." What are the causes? There is no known cause of baby blues. It is likely caused by a combination of factors. However, changes in hormone levels after childbirth are believed to trigger some of the symptoms. Other factors that can play a role in these mood changes include:  Lack of sleep.  Stressful life events, such as poverty, caring for a loved one, or death of a loved one.  Genetics. What are the signs or symptoms? Symptoms of this condition include:  Brief changes  in mood, such as going from extreme happiness to sadness.  Decreased concentration.  Difficulty sleeping.  Crying spells and tearfulness.  Loss of appetite.  Irritability.  Anxiety. If the symptoms of baby blues last for more than 2 weeks or become more severe, you may have postpartum depression. How is this diagnosed? This condition is diagnosed based on an evaluation of your symptoms. There are no medical or lab tests that lead to a diagnosis, but there are various questionnaires that a health care provider may use to identify women with the baby blues or postpartum depression. How is this  treated? Treatment is not needed for this condition. The baby blues usually go away on their own in 1-2 weeks. Social support is often all that is needed. You will be encouraged to get adequate sleep and rest. Follow these instructions at home: Lifestyle      Get as much rest as you can. Take a nap when the baby sleeps.  Exercise regularly as told by your health care provider. Some women find yoga and walking to be helpful.  Eat a balanced and nourishing diet. This includes plenty of fruits and vegetables, whole grains, and lean proteins.  Do little things that you enjoy. Have a cup of tea, take a bubble bath, read your favorite magazine, or listen to your favorite music.  Avoid alcohol.  Ask for help with household chores, cooking, grocery shopping, or running errands. Do not try to do everything yourself. Consider hiring a postpartum doula to help. This is a professional who specializes in providing support to new mothers.  Try not to make any major life changes during pregnancy or right after giving birth. This can add stress. General instructions  Talk to people close to you about how you are feeling. Get support from your partner, family members, friends, or other new moms. You may want to join a support group.  Find ways to cope with stress. This may include: ? Writing your thoughts and feelings in a journal. ? Spending time outside. ? Spending time with people who make you laugh.  Try to stay positive in how you think. Think about the things you are grateful for.  Take over-the-counter and prescription medicines only as told by your health care provider.  Let your health care provider know if you have any concerns.  Keep all postpartum visits as told by your health care provider. This is important. Contact a health care provider if:  Your baby blues do not go away after 2 weeks. Get help right away if:  You have thoughts of taking your own life (suicidal  thoughts).  You think you may harm the baby or other people.  You see or hear things that are not there (hallucinations). Summary  After giving birth, you may feel happy one minute and sad or stressed the next. Feelings of sadness that happen right after the baby is born and go away after a week or two are called the "baby blues."  You can manage the baby blues by getting enough rest, eating a healthy diet, exercising, spending time with supportive people, and finding ways to cope with stress.  If feelings of sadness and stress last longer than 2 weeks or get in the way of caring for your baby, talk to your health care provider. This may mean you have postpartum depression. This information is not intended to replace advice given to you by your health care provider. Make sure you discuss any questions you have  with your health care provider. Document Revised: 09/25/2018 Document Reviewed: 07/30/2016 Elsevier Patient Education  2020 [employer].

## 2020-07-17 ENCOUNTER — Emergency Department: Payer: BC Managed Care – PPO

## 2020-07-17 ENCOUNTER — Other Ambulatory Visit: Payer: Self-pay

## 2020-07-17 ENCOUNTER — Emergency Department
Admission: EM | Admit: 2020-07-17 | Discharge: 2020-07-17 | Disposition: A | Discharge status: Home / Self Care | Payer: BC Managed Care – PPO | Attending: Emergency Medicine | Admitting: Emergency Medicine

## 2020-07-17 DIAGNOSIS — N200 Calculus of kidney: Secondary | ICD-10-CM | POA: Diagnosis not present

## 2020-07-17 DIAGNOSIS — T8332XA Displacement of intrauterine contraceptive device, initial encounter: Secondary | ICD-10-CM | POA: Diagnosis not present

## 2020-07-17 DIAGNOSIS — R109 Unspecified abdominal pain: Secondary | ICD-10-CM | POA: Diagnosis present

## 2020-07-17 HISTORY — DX: Calculus of kidney: N20.0

## 2020-07-17 LAB — URINALYSIS, COMPLETE (UACMP) WITH MICROSCOPIC
Bacteria, UA: NONE SEEN
Bilirubin Urine: NEGATIVE
Glucose, UA: NEGATIVE mg/dL
Ketones, ur: NEGATIVE mg/dL
Nitrite: NEGATIVE
Protein, ur: 30 mg/dL — AB
RBC / HPF: >50 RBC/hpf — ABNORMAL HIGH (ref 0–5)
Specific Gravity, Urine: 1.031 — ABNORMAL HIGH (ref 1.005–1.030)
pH: 5 (ref 5.0–8.0)

## 2020-07-17 LAB — CBC
HCT: 41.6 % (ref 36.0–46.0)
Hemoglobin: 13.6 g/dL (ref 12.0–15.0)
MCH: 29.5 pg (ref 26.0–34.0)
MCHC: 32.7 g/dL (ref 30.0–36.0)
MCV: 90.2 fL (ref 80.0–100.0)
Platelets: 272 10*3/uL (ref 150–400)
RBC: 4.61 MIL/uL (ref 3.87–5.11)
RDW: 13.5 % (ref 11.5–15.5)
WBC: 8.5 10*3/uL (ref 4.0–10.5)
nRBC: 0 % (ref 0.0–0.2)

## 2020-07-17 LAB — BASIC METABOLIC PANEL
Anion gap: 10 (ref 5–15)
BUN: 17 mg/dL (ref 6–20)
CO2: 25 mmol/L (ref 22–32)
Calcium: 9.7 mg/dL (ref 8.9–10.3)
Chloride: 105 mmol/L (ref 98–111)
Creatinine, Ser: 1.2 mg/dL — ABNORMAL HIGH (ref 0.44–1.00)
GFR, Estimated: 59 mL/min — ABNORMAL LOW (ref >60)
Glucose, Bld: 120 mg/dL — ABNORMAL HIGH (ref 70–99)
Potassium: 3.6 mmol/L (ref 3.5–5.1)
Sodium: 140 mmol/L (ref 135–145)

## 2020-07-17 LAB — POC URINE PREG, ED: Preg Test, Ur: NEGATIVE

## 2020-07-17 MED ORDER — OXYCODONE-ACETAMINOPHEN 5-325 MG PO TABS
1.0000 | ORAL_TABLET | Freq: Once | ORAL | Status: AC
  Administered 2020-07-17: 1 via ORAL
  Filled 2020-07-17: qty 1

## 2020-07-17 MED ORDER — OXYCODONE-ACETAMINOPHEN 7.5-325 MG PO TABS: 1.0000 | ORAL_TABLET | Freq: Four times a day (QID) | ORAL | 0 refills | Status: DC | PRN

## 2020-07-17 MED ORDER — METOCLOPRAMIDE HCL 10 MG PO TABS
10.0000 mg | ORAL_TABLET | Freq: Once | ORAL | Status: AC
  Administered 2020-07-17: 10 mg via ORAL
  Filled 2020-07-17: qty 1
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
## 2020-07-17 NOTE — ED Triage Notes (Signed)
Pt c/o right flank pain that radiates into the RLQ since last night with nausea, states she has a hx of kidney stones and this feels similar.

## 2020-07-17 NOTE — Discharge Instructions (Signed)
Krista [name] will contact you today and schedule an ASAP appointment.  Follow discharge care instructions for kidney stones.  Advised medication may cause drowsiness.

## 2020-07-24 NOTE — H&P (Signed)
Krista [name] is a 40 y.o. female presenting with Pre Op Consulting (surgical consult ref by [facility] for iud removal/reinsertion - iud migration )  History of Present Illness: Patient presents for a preoperative visit for a Laparoscopy with Removal of IUD. Surgical indications: IUD migration, malpositioned IUD. Patient had a Mirena IUD placed 06/21/2020, for postpartum contraception, as she is s/p a VAVD in 02/2020.   She presented to the ER 07/17/2018 with reports of right flank pain radiating to RLQ pain. Further evaluation involved a CT renal stone study, which revealed a kidney stone on the right side, and incidentally revealed IUD-related uterine perforation with device displaced into the right adnexa.  Workup:  TVUS 07/2020:  Ut wnl  Endometrium=2.35 mm IUD NOT SEEN IN UT Or rt adnexa bil ovs wnl  Pertinent Hx: -C/S x1  Past Medical History:  has a past medical history of Depression and Maternal iron deficiency anemia affecting pregnancy, antepartum, third trimester.  Past Surgical History:  has a past surgical history that includes Cesarean section. Family History: family history includes Alzheimer's disease in her maternal grandmother; Coronary Artery Disease (Blocked arteries around heart) in her paternal grandfather and paternal grandmother; Deep vein thrombosis (DVT or abnormal blood clot formation) in her paternal aunt; Diabetes type II in her paternal grandfather; Heart disease in her paternal grandfather and paternal grandmother; Hepatitis B in her father; High blood pressure (Hypertension) in her mother; Lung cancer in her paternal aunt; Thyroid disease in her maternal aunt, maternal grandmother, and mother. Social History:  reports that she has never smoked. She has never used smokeless tobacco. She reports previous alcohol use of about 1.0 standard drink of alcohol per week. She reports that she does not use drugs. OB/GYN History:  OB History    Gravida  4   Para  3    Term  3   Preterm      AB  1   Living  3     SAB  1   IAB      Ectopic      Molar      Multiple      Live Births  3         Allergies: is allergic to ibuprofen and naproxen. Medications:  Current Outpatient Medications:  .  oxyCODONE-acetaminophen (PERCOCET) 7.5-325 mg tablet, TAKE 1 TABLET BY MOUTH EVERY 6 HOURS AS NEEDED FOR SEVERE PAIN, Disp: , Rfl:  .  SUMAtriptan (IMITREX) 100 MG tablet, Take at onset of migrainous symptoms. May take second two hours later if needed, Disp: 9 tablet, Rfl: 2 .  venlafaxine (EFFEXOR-XR) 75 MG XR capsule, Take 1 capsule (75 mg total) by mouth once daily, Disp: 90 capsule, Rfl: 3 .  niFEdipine (ADALAT CC) 60 MG ER tablet, Take by mouth (Patient not taking: Reported on 07/18/2020  ), Disp: , Rfl:  .  pnv166-iron-FA-o3-dha-epa-fish 27 mg-800 mcg- 250 mg-200 mg Cap, Take by mouth (Patient not taking: Reported on 06/21/2020  ), Disp: , Rfl:   Review of Systems: No SOB, no palpitations or chest pain, no new lower extremity edema, no nausea or vomiting or bowel or bladder complaints. See HPI for gyn specific ROS.   Exam:   BP 129/85   Pulse 96   Ht 149.9 cm (4\' 11" )   Wt 67 kg (147 lb 9.6 oz)   LMP 06/06/2019 (LMP Unknown)   BMI 29.81 kg/m   General: Well-developed, well-nourished  female in no acute distress Lungs: CTA  CV : RRR  without murmur   Breast: deferred Neck:  no thyromegaly, no lymphadenopathy Abdomen: soft, no masses, normal active bowel sounds,  non-tender, no rebound tenderness, no hernias noted Genitalia:     Pelvic exam: deferred  Impression:   The primary encounter diagnosis was Malpositioned IUD, initial encounter. A diagnosis of Counseling for birth control regarding intrauterine device (IUD) was also pertinent to this visit.  Plan:   1.  -Patient returns for a preoperative discussion regarding her plans to proceed with surgical treatment of her abdominal IUD by dx lap procedure. The patient and  I discussed the technical aspects of the procedure including the potential for risks and complications.   We will also replace IUD  These include but are not limited to the risk of infection requiring post-operative antibiotics or further procedures. We talked about the risk of injury to adjacent organs including bladder, bowel, ureter, blood vessels or nerves. We talked about the need to convert to an open incision. We talked about the possible need for blood transfusion. We talked aboutpostop complications such asthromboembolic or cardiopulmonary complications. All of her questions were answered.  Her preoperative exam was completed and the appropriate consents were signed. She is scheduled to undergo this procedure in the near future.  Specific Peri-operative Considerations:  - Consent: obtained today - Health Maintenance: up to date - Labs: CBC, CMP preoperatively - Studies: EKG, CXR preoperatively - Bowel Preparation: None required - Abx:  None indicated - VTE ppx: SCDs perioperatively - Glucose Protocol: n/a - Beta-blockade: n/a    Return for Postop check.

## 2020-07-25 ENCOUNTER — Other Ambulatory Visit: Payer: Self-pay | Admitting: Obstetrics and Gynecology

## 2020-07-27 ENCOUNTER — Other Ambulatory Visit
Admission: RE | Admit: 2020-07-27 | Discharge: 2020-07-27 | Disposition: A | Discharge status: Home / Self Care | Payer: BC Managed Care – PPO | Source: Ambulatory Visit | Attending: Obstetrics and Gynecology | Admitting: Obstetrics and Gynecology

## 2020-07-27 ENCOUNTER — Other Ambulatory Visit: Payer: Self-pay

## 2020-07-27 HISTORY — DX: Personal history of urinary calculi: Z87.442

## 2020-07-27 HISTORY — DX: Nausea with vomiting, unspecified: R11.2

## 2020-07-27 HISTORY — DX: Other specified postprocedural states: Z98.890

## 2020-07-27 HISTORY — DX: Depression, unspecified: F32.A

## 2020-07-27 HISTORY — DX: Anemia, unspecified: D64.9

## 2020-07-27 NOTE — Patient Instructions (Addendum)
Your procedure is scheduled on: Wednesday, February 16 Report to the Registration Desk on the 1st floor of the [facility] To find out your arrival time, please call [phone] between 1PM - 3PM on: Tuesday, February 15  REMEMBER: Instructions that are not followed completely may result in serious medical risk, up to and including death; or upon the discretion of your surgeon and anesthesiologist your surgery may need to be rescheduled.  Do not eat food after midnight the night before surgery.  No gum chewing, lozengers or hard candies.  You may however, drink CLEAR liquids up to 2 hours before you are scheduled to arrive for your surgery. Do not drink anything within 2 hours of your scheduled arrival time.  Clear liquids include: - water  - apple juice without pulp - gatorade (not RED, PURPLE, OR BLUE) - black coffee or tea (Do NOT add milk or creamers to the coffee or tea) Do NOT drink anything that is not on this list.  TAKE THESE MEDICATIONS THE MORNING OF SURGERY WITH A SIP OF WATER:  1.  Venlafaxine  One week prior to surgery: starting February 9 Stop Anti-inflammatories (NSAIDS) such as Advil, Aleve, Ibuprofen, Motrin, Naproxen, Naprosyn and Aspirin based products such as Excedrin, Goodys Powder, BC Powder. Stop ANY OVER THE COUNTER supplements until after surgery. Stop moringa  No Alcohol for 24 hours before or after surgery.  No Smoking including e-cigarettes for 24 hours prior to surgery.  No chewable tobacco products for at least 6 hours prior to surgery.  No nicotine patches on the day of surgery.  Do not use any "recreational" drugs for at least a week prior to your surgery.  Please be advised that the combination of cocaine and anesthesia may have negative outcomes, up to and including death. If you test positive for cocaine, your surgery will be cancelled.  On the morning of surgery brush your teeth with toothpaste and water, you may rinse your mouth with  mouthwash if you wish. Do not swallow any toothpaste or mouthwash.  Do not wear jewelry, make-up, hairpins, clips or nail polish.  Do not wear lotions, powders, or perfumes.   Do not shave body from the neck down 48 hours prior to surgery just in case you cut yourself which could leave a site for infection.  Also, freshly shaved skin may become irritated if using the CHG soap.  Contact lenses, hearing aids and dentures may not be worn into surgery.  Do not bring valuables to the hospital. [facility] is not responsible for any missing/lost belongings or valuables.   Use CHG Soap as directed on instruction sheet.  Notify your doctor if there is any change in your medical condition (cold, fever, infection).  Wear comfortable clothing (specific to your surgery type) to the hospital.  Plan for stool softeners for home use; pain medications have a tendency to cause constipation. You can also help prevent constipation by eating foods high in fiber such as fruits and vegetables and drinking plenty of fluids as your diet allows.  After surgery, you can help prevent lung complications by doing breathing exercises.  Take deep breaths and cough every 1-2 hours. Your doctor may order a device called an Incentive Spirometer to help you take deep breaths. When coughing or sneezing, hold a pillow firmly against your incision with both hands. This is called "splinting." Doing this helps protect your incision. It also decreases belly discomfort.  If you are being discharged the day of surgery, you  will not be allowed to drive home. You will need a responsible adult (18 years or older) to drive you home and stay with you that night.   If you are taking public transportation, you will need to have a responsible adult (18 years or older) with you. Please confirm with your physician that it is acceptable to use public transportation.   Please call the Pre-admissions Testing Dept. at [phone] if you  have any questions about these instructions.  Visitation Policy:  Patients undergoing a surgery or procedure may have one family member or support person with them as long as that person is not COVID-19 positive or experiencing its symptoms.  That person may remain in the waiting area during the procedure.

## 2020-07-31 ENCOUNTER — Other Ambulatory Visit: Payer: BC Managed Care – PPO

## 2020-07-31 ENCOUNTER — Encounter
Admission: RE | Admit: 2020-07-31 | Discharge: 2020-07-31 | Disposition: A | Discharge status: Home / Self Care | Payer: BC Managed Care – PPO | Source: Ambulatory Visit | Attending: Obstetrics and Gynecology | Admitting: Obstetrics and Gynecology

## 2020-07-31 ENCOUNTER — Other Ambulatory Visit: Payer: Self-pay

## 2020-07-31 DIAGNOSIS — X58XXXA Exposure to other specified factors, initial encounter: Secondary | ICD-10-CM | POA: Diagnosis not present

## 2020-07-31 DIAGNOSIS — Z8759 Personal history of other complications of pregnancy, childbirth and the puerperium: Secondary | ICD-10-CM | POA: Diagnosis not present

## 2020-07-31 DIAGNOSIS — Z01812 Encounter for preprocedural laboratory examination: Secondary | ICD-10-CM | POA: Insufficient documentation

## 2020-07-31 DIAGNOSIS — T8332XA Displacement of intrauterine contraceptive device, initial encounter: Secondary | ICD-10-CM | POA: Diagnosis not present

## 2020-07-31 DIAGNOSIS — Z20822 Contact with and (suspected) exposure to covid-19: Secondary | ICD-10-CM | POA: Insufficient documentation

## 2020-07-31 DIAGNOSIS — Z886 Allergy status to analgesic agent status: Secondary | ICD-10-CM | POA: Diagnosis not present

## 2020-07-31 DIAGNOSIS — Z79899 Other long term (current) drug therapy: Secondary | ICD-10-CM | POA: Diagnosis not present

## 2020-07-31 DIAGNOSIS — Z30430 Encounter for insertion of intrauterine contraceptive device: Secondary | ICD-10-CM | POA: Diagnosis not present

## 2020-07-31 LAB — CBC
HCT: 40.1 % (ref 36.0–46.0)
Hemoglobin: 13.3 g/dL (ref 12.0–15.0)
MCH: 29.9 pg (ref 26.0–34.0)
MCHC: 33.2 g/dL (ref 30.0–36.0)
MCV: 90.1 fL (ref 80.0–100.0)
Platelets: 252 10*3/uL (ref 150–400)
RBC: 4.45 MIL/uL (ref 3.87–5.11)
RDW: 13.6 % (ref 11.5–15.5)
WBC: 4.1 10*3/uL (ref 4.0–10.5)
nRBC: 0 % (ref 0.0–0.2)

## 2020-07-31 LAB — BASIC METABOLIC PANEL
Anion gap: 11 (ref 5–15)
BUN: 14 mg/dL (ref 6–20)
CO2: 27 mmol/L (ref 22–32)
Calcium: 9.4 mg/dL (ref 8.9–10.3)
Chloride: 101 mmol/L (ref 98–111)
Creatinine, Ser: 0.9 mg/dL (ref 0.44–1.00)
GFR, Estimated: >60 mL/min (ref >60)
Glucose, Bld: 113 mg/dL — ABNORMAL HIGH (ref 70–99)
Potassium: 3.3 mmol/L — ABNORMAL LOW (ref 3.5–5.1)
Sodium: 139 mmol/L (ref 135–145)

## 2020-07-31 LAB — TYPE AND SCREEN
ABO/RH(D): O POS
Antibody Screen: NEGATIVE

## 2020-07-31 LAB — SARS CORONAVIRUS 2 (TAT 6-24 HRS): SARS Coronavirus 2: NEGATIVE

## 2020-08-01 MED ORDER — ORAL CARE MOUTH RINSE: 15.0000 mL | Freq: Once | OROMUCOSAL | Status: AC

## 2020-08-01 MED ORDER — CHLORHEXIDINE GLUCONATE 0.12 % MT SOLN: 15.0000 mL | Freq: Once | OROMUCOSAL | Status: AC

## 2020-08-01 MED ORDER — ACETAMINOPHEN 500 MG PO TABS: 1000.0000 mg | ORAL_TABLET | ORAL | Status: AC

## 2020-08-01 MED ORDER — LACTATED RINGERS IV SOLN: INTRAVENOUS | Status: DC

## 2020-08-01 MED ORDER — SCOPOLAMINE 1 MG/3DAYS TD PT72: 1.0000 | MEDICATED_PATCH | Freq: Once | TRANSDERMAL | Status: DC

## 2020-08-01 MED ORDER — GABAPENTIN 300 MG PO CAPS: 300.0000 mg | ORAL_CAPSULE | ORAL | Status: AC

## 2020-08-01 MED ORDER — FAMOTIDINE 20 MG PO TABS: 20.0000 mg | ORAL_TABLET | Freq: Once | ORAL | Status: AC

## 2020-08-02 ENCOUNTER — Ambulatory Visit
Admission: RE | Admit: 2020-08-02 | Discharge: 2020-08-02 | Disposition: A | Discharge status: Home / Self Care | Payer: BC Managed Care – PPO | Attending: Obstetrics and Gynecology | Admitting: Obstetrics and Gynecology

## 2020-08-02 ENCOUNTER — Other Ambulatory Visit: Payer: Self-pay

## 2020-08-02 ENCOUNTER — Ambulatory Visit: Payer: BC Managed Care – PPO | Admitting: Anesthesiology

## 2020-08-02 ENCOUNTER — Encounter
Admission: RE | Disposition: A | Discharge status: Home / Self Care | Payer: Self-pay | Source: Home / Self Care | Attending: Obstetrics and Gynecology

## 2020-08-02 ENCOUNTER — Encounter: Payer: Self-pay | Admitting: Obstetrics and Gynecology

## 2020-08-02 DIAGNOSIS — Z79899 Other long term (current) drug therapy: Secondary | ICD-10-CM | POA: Insufficient documentation

## 2020-08-02 DIAGNOSIS — T8332XA Displacement of intrauterine contraceptive device, initial encounter: Secondary | ICD-10-CM | POA: Diagnosis not present

## 2020-08-02 DIAGNOSIS — Z8759 Personal history of other complications of pregnancy, childbirth and the puerperium: Secondary | ICD-10-CM | POA: Insufficient documentation

## 2020-08-02 DIAGNOSIS — Z30430 Encounter for insertion of intrauterine contraceptive device: Secondary | ICD-10-CM | POA: Insufficient documentation

## 2020-08-02 DIAGNOSIS — X58XXXA Exposure to other specified factors, initial encounter: Secondary | ICD-10-CM | POA: Insufficient documentation

## 2020-08-02 DIAGNOSIS — Z886 Allergy status to analgesic agent status: Secondary | ICD-10-CM | POA: Insufficient documentation

## 2020-08-02 DIAGNOSIS — Z20822 Contact with and (suspected) exposure to covid-19: Secondary | ICD-10-CM | POA: Insufficient documentation

## 2020-08-02 HISTORY — PX: INTRAUTERINE DEVICE (IUD) INSERTION: SHX5877

## 2020-08-02 HISTORY — PX: XI ROBOT ASSISTED DIAGNOSTIC LAPAROSCOPY: SHX6815

## 2020-08-02 LAB — POCT PREGNANCY, URINE: Preg Test, Ur: NEGATIVE

## 2020-08-02 SURGERY — LAPAROSCOPY, DIAGNOSTIC, ROBOT-ASSISTED
Anesthesia: General | Site: Abdomen

## 2020-08-02 MED ORDER — SCOPOLAMINE 1 MG/3DAYS TD PT72
MEDICATED_PATCH | TRANSDERMAL | Status: AC
  Administered 2020-08-02: 1.5 mg via TRANSDERMAL
  Filled 2020-08-02: qty 1

## 2020-08-02 MED ORDER — ACETAMINOPHEN 500 MG PO TABS: 1000.0000 mg | ORAL_TABLET | Freq: Four times a day (QID) | ORAL | 0 refills | Status: AC

## 2020-08-02 MED ORDER — LEVONORGESTREL 20 MCG/24HR IU IUD
1.0000 | INTRAUTERINE_SYSTEM | Freq: Once | INTRAUTERINE | Status: AC
  Administered 2020-08-02: 17:00:00 1 via INTRAUTERINE
  Filled 2020-08-02: qty 1

## 2020-08-02 MED ORDER — GABAPENTIN 800 MG PO TABS: 800.0000 mg | ORAL_TABLET | Freq: Every day | ORAL | 0 refills | Status: AC

## 2020-08-02 MED ORDER — LIDOCAINE HCL (PF) 2 % IJ SOLN
INTRAMUSCULAR | Status: AC
  Filled 2020-08-02: qty 5

## 2020-08-02 MED ORDER — CHLORHEXIDINE GLUCONATE 0.12 % MT SOLN
OROMUCOSAL | Status: AC
  Administered 2020-08-02: 15 mL via OROMUCOSAL
  Filled 2020-08-02: qty 15

## 2020-08-02 MED ORDER — DEXMEDETOMIDINE (PRECEDEX) IN NS 20 MCG/5ML (4 MCG/ML) IV SYRINGE
PREFILLED_SYRINGE | INTRAVENOUS | Status: AC
  Filled 2020-08-02: qty 5

## 2020-08-02 MED ORDER — DEXAMETHASONE SODIUM PHOSPHATE 10 MG/ML IJ SOLN
INTRAMUSCULAR | Status: AC
  Filled 2020-08-02: qty 1

## 2020-08-02 MED ORDER — BUPIVACAINE HCL (PF) 0.5 % IJ SOLN
INTRAMUSCULAR | Status: AC
  Filled 2020-08-02: qty 30

## 2020-08-02 MED ORDER — FENTANYL CITRATE (PF) 100 MCG/2ML IJ SOLN
INTRAMUSCULAR | Status: AC
  Filled 2020-08-02: qty 2

## 2020-08-02 MED ORDER — MIDAZOLAM HCL 2 MG/2ML IJ SOLN
INTRAMUSCULAR | Status: DC | PRN
  Administered 2020-08-02: 2 mg via INTRAVENOUS

## 2020-08-02 MED ORDER — SUGAMMADEX SODIUM 200 MG/2ML IV SOLN
INTRAVENOUS | Status: DC | PRN
  Administered 2020-08-02: 200 mg via INTRAVENOUS

## 2020-08-02 MED ORDER — PROPOFOL 500 MG/50ML IV EMUL
INTRAVENOUS | Status: AC
  Filled 2020-08-02: qty 50

## 2020-08-02 MED ORDER — ONDANSETRON HCL 4 MG/2ML IJ SOLN
INTRAMUSCULAR | Status: AC
  Filled 2020-08-02: qty 2

## 2020-08-02 MED ORDER — ROCURONIUM BROMIDE 100 MG/10ML IV SOLN
INTRAVENOUS | Status: DC | PRN
  Administered 2020-08-02: 50 mg via INTRAVENOUS

## 2020-08-02 MED ORDER — ONDANSETRON HCL 4 MG/2ML IJ SOLN
INTRAMUSCULAR | Status: DC | PRN
  Administered 2020-08-02: 4 mg via INTRAVENOUS

## 2020-08-02 MED ORDER — FENTANYL CITRATE (PF) 100 MCG/2ML IJ SOLN
INTRAMUSCULAR | Status: DC | PRN
  Administered 2020-08-02: 100 ug via INTRAVENOUS

## 2020-08-02 MED ORDER — OXYCODONE HCL 5 MG/5ML PO SOLN: 5.0000 mg | Freq: Once | ORAL | Status: AC | PRN

## 2020-08-02 MED ORDER — PROPOFOL 10 MG/ML IV BOLUS
INTRAVENOUS | Status: AC
  Filled 2020-08-02: qty 20

## 2020-08-02 MED ORDER — MIDAZOLAM HCL 2 MG/2ML IJ SOLN
INTRAMUSCULAR | Status: AC
  Filled 2020-08-02: qty 2

## 2020-08-02 MED ORDER — DEXAMETHASONE SODIUM PHOSPHATE 10 MG/ML IJ SOLN
INTRAMUSCULAR | Status: DC | PRN
  Administered 2020-08-02: 10 mg via INTRAVENOUS

## 2020-08-02 MED ORDER — PROMETHAZINE HCL 25 MG/ML IJ SOLN
6.2500 mg | INTRAMUSCULAR | Status: DC | PRN
Start: 2020-08-02 — End: 2020-08-02

## 2020-08-02 MED ORDER — SODIUM CHLORIDE FLUSH 0.9 % IV SOLN
INTRAVENOUS | Status: AC
  Filled 2020-08-02: qty 10

## 2020-08-02 MED ORDER — LACTATED RINGERS IV SOLN: INTRAVENOUS | Status: DC

## 2020-08-02 MED ORDER — BUPIVACAINE HCL (PF) 0.5 % IJ SOLN
INTRAMUSCULAR | Status: DC | PRN
  Administered 2020-08-02: 9 mL

## 2020-08-02 MED ORDER — FAMOTIDINE 20 MG PO TABS
ORAL_TABLET | ORAL | Status: AC
  Administered 2020-08-02: 20 mg via ORAL
  Filled 2020-08-02: qty 1

## 2020-08-02 MED ORDER — DOCUSATE SODIUM 100 MG PO CAPS: 100.0000 mg | ORAL_CAPSULE | Freq: Two times a day (BID) | ORAL | 0 refills | Status: AC

## 2020-08-02 MED ORDER — OXYCODONE HCL 5 MG PO TABS
5.0000 mg | ORAL_TABLET | Freq: Once | ORAL | Status: AC | PRN
  Administered 2020-08-02: 5 mg via ORAL

## 2020-08-02 MED ORDER — PROPOFOL 500 MG/50ML IV EMUL
INTRAVENOUS | Status: DC | PRN
  Administered 2020-08-02: 160 ug/kg/min via INTRAVENOUS

## 2020-08-02 MED ORDER — PROPOFOL 10 MG/ML IV BOLUS
INTRAVENOUS | Status: DC | PRN
  Administered 2020-08-02: 180 mg via INTRAVENOUS

## 2020-08-02 MED ORDER — FENTANYL CITRATE (PF) 100 MCG/2ML IJ SOLN: 25.0000 ug | INTRAMUSCULAR | Status: DC | PRN

## 2020-08-02 MED ORDER — OXYCODONE HCL 5 MG PO TABS: 5.0000 mg | ORAL_TABLET | ORAL | 0 refills | Status: AC | PRN

## 2020-08-02 MED ORDER — ACETAMINOPHEN 500 MG PO TABS
ORAL_TABLET | ORAL | Status: AC
  Administered 2020-08-02: 1000 mg via ORAL
  Filled 2020-08-02: qty 2

## 2020-08-02 MED ORDER — OXYCODONE HCL 5 MG PO TABS
ORAL_TABLET | ORAL | Status: AC
  Filled 2020-08-02: qty 1

## 2020-08-02 MED ORDER — DEXMEDETOMIDINE (PRECEDEX) IN NS 20 MCG/5ML (4 MCG/ML) IV SYRINGE
PREFILLED_SYRINGE | INTRAVENOUS | Status: DC | PRN
  Administered 2020-08-02: 8 ug via INTRAVENOUS
  Administered 2020-08-02: 12 ug via INTRAVENOUS

## 2020-08-02 MED ORDER — GABAPENTIN 300 MG PO CAPS
ORAL_CAPSULE | ORAL | Status: AC
  Administered 2020-08-02: 300 mg via ORAL
  Filled 2020-08-02: qty 1

## 2020-08-02 MED ORDER — LEVONORGESTREL 20 MCG/24HR IU IUD
INTRAUTERINE_SYSTEM | INTRAUTERINE | Status: AC
  Filled 2020-08-02: qty 1

## 2020-08-02 MED ORDER — ROCURONIUM BROMIDE 10 MG/ML (PF) SYRINGE
PREFILLED_SYRINGE | INTRAVENOUS | Status: AC
  Filled 2020-08-02: qty 10

## 2020-08-02 SURGICAL SUPPLY — 73 items
BAG URINE DRAIN 2000ML AR STRL (UROLOGICAL SUPPLIES) ×3 IMPLANT
BLADE SURG SZ11 CARB STEEL (BLADE) ×3 IMPLANT
CANISTER SUCT 1200ML W/VALVE (MISCELLANEOUS) ×3 IMPLANT
CATH FOLEY 2WAY  5CC 16FR (CATHETERS) ×1
CATH URTH 16FR FL 2W BLN LF (CATHETERS) ×2 IMPLANT
CHLORAPREP W/TINT 26 (MISCELLANEOUS) ×3 IMPLANT
CORD BIP STRL DISP 12FT (MISCELLANEOUS) IMPLANT
CORD MONOPOLAR M/FML 12FT (MISCELLANEOUS) IMPLANT
COVER TIP SHEARS 8 DVNC (MISCELLANEOUS) IMPLANT
COVER TIP SHEARS 8MM DA VINCI (MISCELLANEOUS)
COVER WAND RF STERILE (DRAPES) ×3 IMPLANT
DEFOGGER SCOPE WARMER CLEARIFY (MISCELLANEOUS) ×3 IMPLANT
DERMABOND ADVANCED (GAUZE/BANDAGES/DRESSINGS) ×1
DERMABOND ADVANCED .7 DNX12 (GAUZE/BANDAGES/DRESSINGS) ×2 IMPLANT
DRAPE 3/4 80X56 (DRAPES) ×6 IMPLANT
DRAPE ARM DVNC X/XI (DISPOSABLE) ×6 IMPLANT
DRAPE DA VINCI XI ARM (DISPOSABLE) ×3
DRAPE LEGGINS SURG 28X43 STRL (DRAPES) ×3 IMPLANT
DRAPE UNDER BUTTOCK W/FLU (DRAPES) ×3 IMPLANT
ELECT REM PT RETURN 9FT ADLT (ELECTROSURGICAL) ×3
ELECTRODE REM PT RTRN 9FT ADLT (ELECTROSURGICAL) ×2 IMPLANT
FILTER LAP SMOKE EVAC STRL (MISCELLANEOUS) IMPLANT
GLOVE SURG ENC MOIS LTX SZ7 (GLOVE) ×12 IMPLANT
GLOVE SURG UNDER LTX SZ7.5 (GLOVE) ×12 IMPLANT
GOWN STRL REUS W/ TWL LRG LVL3 (GOWN DISPOSABLE) ×16 IMPLANT
GOWN STRL REUS W/TWL LRG LVL3 (GOWN DISPOSABLE) ×8
GRASPER SUT TROCAR 14GX15 (MISCELLANEOUS) IMPLANT
IRRIGATION STRYKERFLOW (MISCELLANEOUS) IMPLANT
IRRIGATOR STRYKERFLOW (MISCELLANEOUS)
IV NS 1000ML (IV SOLUTION) ×1
IV NS 1000ML BAXH (IV SOLUTION) ×2 IMPLANT
KIT PINK PAD W/HEAD ARE REST (MISCELLANEOUS) ×3
KIT PINK PAD W/HEAD ARM REST (MISCELLANEOUS) ×2 IMPLANT
KIT TURNOVER CYSTO (KITS) ×3 IMPLANT
LABEL OR SOLS (LABEL) ×3 IMPLANT
MANIFOLD NEPTUNE II (INSTRUMENTS) ×3 IMPLANT
MANIPULATOR VCARE LG CRV RETR (MISCELLANEOUS) IMPLANT
MANIPULATOR VCARE SML CRV RETR (MISCELLANEOUS) IMPLANT
MANIPULATOR VCARE STD CRV RETR (MISCELLANEOUS) IMPLANT
Mirena 52mg ×3 IMPLANT
NEEDLE VERESS 14GA 120MM (NEEDLE) ×3 IMPLANT
NS IRRIG 1000ML POUR BTL (IV SOLUTION) ×3 IMPLANT
OBTURATOR OPTICAL STANDARD 8MM (TROCAR) ×1
OBTURATOR OPTICAL STND 8 DVNC (TROCAR) ×2
OBTURATOR OPTICALSTD 8 DVNC (TROCAR) ×2 IMPLANT
OCCLUDER COLPOPNEUMO (BALLOONS) IMPLANT
PACK GYN LAPAROSCOPIC (MISCELLANEOUS) ×3 IMPLANT
PAD ARMBOARD 7.5X6 YLW CONV (MISCELLANEOUS) ×3 IMPLANT
PAD OB MATERNITY 4.3X12.25 (PERSONAL CARE ITEMS) ×3 IMPLANT
PAD PREP 24X41 OB/GYN DISP (PERSONAL CARE ITEMS) ×3 IMPLANT
SCISSORS METZENBAUM CVD 33 (INSTRUMENTS) IMPLANT
SEAL CANN UNIV 5-8 DVNC XI (MISCELLANEOUS) IMPLANT
SEAL XI 5MM-8MM UNIVERSAL (MISCELLANEOUS)
SET CYSTO W/LG BORE CLAMP LF (SET/KITS/TRAYS/PACK) ×3 IMPLANT
SOLUTION ELECTROLUBE (MISCELLANEOUS) ×3 IMPLANT
SURGILUBE 2OZ TUBE FLIPTOP (MISCELLANEOUS) IMPLANT
SUT DVC VLOC 180 0 12IN GS21 (SUTURE)
SUT MNCRL 4-0 (SUTURE) ×1
SUT MNCRL 4-0 27XMFL (SUTURE) ×2
SUT VIC AB 0 CT2 27 (SUTURE) IMPLANT
SUT VIC AB 1 CT1 36 (SUTURE) IMPLANT
SUT VIC AB 2-0 CT1 27 (SUTURE)
SUT VIC AB 2-0 CT1 TAPERPNT 27 (SUTURE) IMPLANT
SUT VIC AB 4-0 FS2 27 (SUTURE) IMPLANT
SUT VICRYL 0 AB UR-6 (SUTURE) IMPLANT
SUTURE DVC VLC 180 0 12IN GS21 (SUTURE) IMPLANT
SUTURE MNCRL 4-0 27XMF (SUTURE) ×2 IMPLANT
SYR 10ML LL (SYRINGE) ×3 IMPLANT
SYR 50ML LL SCALE MARK (SYRINGE) ×3 IMPLANT
TROCAR 12M 150ML BLUNT (TROCAR) IMPLANT
TROCAR ENDO BLADELESS 11MM (ENDOMECHANICALS) IMPLANT
TROCAR XCEL 12X100 BLDLESS (ENDOMECHANICALS) IMPLANT
TUBING EVAC SMOKE HEATED PNEUM (TUBING) ×3 IMPLANT

## 2020-08-02 NOTE — Interval H&P Note (Signed)
History and Physical Interval Note:  08/02/2020 3:33 PM  Krista [name]  has presented today for surgery, with the diagnosis of abdominal IUD.  The various methods of treatment have been discussed with the patient and family. After consideration of risks, benefits and other options for treatment, the patient has consented to  Procedure(s): XI ROBOT ASSISTED DIAGNOSTIC LAPAROSCOPY, REMOVAL AND LOCATION OF IUD (N/A) as a surgical intervention.  The patient's history has been reviewed, patient examined, no change in status, stable for surgery.  I have reviewed the patient's chart and labs.  Questions were answered to the patient's satisfaction.     Christeen Douglas

## 2020-08-02 NOTE — Anesthesia Preprocedure Evaluation (Addendum)
Anesthesia Evaluation  Patient identified by MRN, date of birth, ID band Patient awake    Reviewed: Allergy & Precautions, H&P , NPO status , Patient's Chart, lab work & pertinent test results  History of Anesthesia Complications (+) PONVNegative for: history of anesthetic complications  Airway Mallampati: II  TM Distance: >3 FB Neck ROM: full    Dental  (+) Teeth Intact   Pulmonary neg pulmonary ROS, neg sleep apnea, neg COPD,    breath sounds clear to auscultation       Cardiovascular (-) angina(-) Past MI and (-) Cardiac Stents negative cardio ROS  (-) dysrhythmias  Rhythm:regular Rate:Normal     Neuro/Psych  Headaches, PSYCHIATRIC DISORDERS Depression    GI/Hepatic negative GI ROS, Neg liver ROS,   Endo/Other  negative endocrine ROS  Renal/GU      Musculoskeletal   Abdominal   Peds  Hematology negative hematology ROS (+)   Anesthesia Other Findings Past Medical History: 2014,2016: Anemia     Comment:  during pregnancy No date: Depression No date: Headache No date: History of kidney stones No date: PONV (postoperative nausea and vomiting)  Past Surgical History: 08/06/2013: CESAREAN SECTION  BMI    Body Mass Index: 28.88 kg/m      Reproductive/Obstetrics negative OB ROS                            Anesthesia Physical Anesthesia Plan  ASA: II  Anesthesia Plan: General ETT   Post-op Pain Management:    Induction:   PONV Risk Score and Plan: 4 or greater and Ondansetron, Dexamethasone, Midazolam, Treatment may vary due to age or medical condition, Propofol infusion and TIVA  Airway Management Planned:   Additional Equipment:   Intra-op Plan:   Post-operative Plan:   Informed Consent: I have reviewed the patients History and Physical, chart, labs and discussed the procedure including the risks, benefits and alternatives for the proposed anesthesia with the patient  or authorized representative who has indicated his/her understanding and acceptance.     Dental Advisory Given  Plan Discussed with: Anesthesiologist, CRNA and Surgeon  Anesthesia Plan Comments:       Anesthesia Quick Evaluation

## 2020-08-02 NOTE — Op Note (Signed)
Krista [name] 08/02/2020  PREOPERATIVE DIAGNOSIS:  Abdominal IUD  POSTOPERATIVE DIAGNOSIS:  Same  PROCEDURE:   1. Robotically assisted laparoscopic removal of abdominal IUD  2. Placement of Mirena IUD  ANESTHESIA:  General endotracheal  ANESTHESIOLOGIST: Yves Dill, MD Anesthesiologist: Yves Dill, MD CRNA: Jaye Beagle, CRNA  SURGEON: Cline Cools, MD  COMPLICATIONS:  None immediate.  ESTIMATED BLOOD LOSS:  Less than 20 ml.  FLUIDS: 500 ml LR.  URINE OUTPUT:  100 ml of clear urine.  INDICATIONS: 40 y.o. G3P2003  with IUD in the left lower pelvis. Other reversible forms of contraception were discussed with patient; she declines all other modalities.  Risks of procedure discussed with patient including permanence of method, bleeding, infection, injury to surrounding organs and need for additional procedures including laparotomy, risk of regret.  Failure risk of 0.5-1% with increased risk of ectopic gestation if pregnancy occurs was also discussed with patient.      FINDINGS:  Normal uterus, tubes, and ovaries.  IUD noted in the posterior cul-de-sac, not entangled in any organs.  Small blood clot at the serosal side of the uterine fundus, which bled when removed.  It appears that this is the area of perforation.  TECHNIQUE:  The patient was taken to the operating room where general anesthesia was obtained without difficulty.  She was then placed in the dorsal lithotomy position and prepared and draped in sterile fashion.  The bladder was cathed for an estimated amount of clear urine. After an adequate timeout was performed, a bivalved speculum was then placed in the patient's vagina, and the anterior lip of cervix grasped with the single-tooth tenaculum.  A dilator to use as a uterine manipulator was then advanced into the uterus and taped to the tenaculum.  The speculum was removed from the vagina.   Attention was then turned to the patient's abdomen where a 8-mm  skin incision was made in the umbilical fold.  The Optiview 8-mm trocar and robotic sleeve were then advanced without difficulty with the laparoscope under direct visualization into the abdomen.  The abdomen was then insufflated with carbon dioxide gas and adequate pneumoperitoneum was obtained.  A survey of the patient's pelvis and abdomen revealed entirely normal anatomy.     A skin incision was made 10 mm lateral to the umbilicus on either side.  The remaining 2 robotic arm trochars were placed under direct visualization.  The patient was placed in deep Trendelenburg.  The robot was docked in the standard fashion.  2 graspers were placed into the patient's pelvis to find the IUD.  The IUD was removed through one of the ports, and brief bipolar cautery was used on the uterine fundus to stop the bleeding at the site of the hole mentioned above in findings.  The camera was left in place, and new IUD was inserted through the cervix in the normal fashion.  This was done under direct laparoscopic visualization to ensure no perforation.  The strings were cut to 3 cm.  All instruments were removed from the vagina.  The robot was then undocked, and the abdomen desufflated, with several deep breaths.  The trochars were removed, and the skin was closed with a single interrupted of 2-0 Monocryl.  Dermabond glue was placed over the incisions.  The Foley catheter was removed from the Plaisance bladder, she tolerated the procedure well.  Sponge, lap, and needle counts were correct times two.  The patient was then taken to the recovery room awake, extubated  and in stable condition.

## 2020-08-02 NOTE — Anesthesia Procedure Notes (Signed)
Procedure Name: Intubation Date/Time: 08/02/2020 4:08 PM Performed by: Jaye Beagle, CRNA Pre-anesthesia Checklist: Patient identified, Emergency Drugs available, Suction available and Patient being monitored Patient Re-evaluated:Patient Re-evaluated prior to induction Oxygen Delivery Method: Circle system utilized Preoxygenation: Pre-oxygenation with 100% oxygen Induction Type: IV induction Ventilation: Mask ventilation without difficulty Laryngoscope Size: McGraph and 3 Grade View: Grade I Tube type: Oral Tube size: 7.0 mm Number of attempts: 1 Airway Equipment and Method: Stylet and Oral airway Placement Confirmation: ETT inserted through vocal cords under direct vision,  positive ETCO2 and breath sounds checked- equal and bilateral Secured at: 21 cm Tube secured with: Tape Dental Injury: Teeth and Oropharynx as per pre-operative assessment

## 2020-08-02 NOTE — Discharge Instructions (Signed)
Laparoscopic Ovarian Surgery Discharge Instructions  For the next three days, take ibuprofen (if you can take it) and acetaminophen on a schedule, every 8 hours. You can take them together or you can intersperse them, and take one every four hours. I also gave you gabapentin for nighttime, to help you sleep and also to control pain. Take gabapentin medicines at night if you need it. You also have a narcotic, oxycodone, to take as needed if the above medicines don't help.  Postop constipation is a major cause of pain. Stay well hydrated, walk as you tolerate, and take over the counter senna as well as stool softeners if you need them.   RISKS AND COMPLICATIONS   Infection.  Bleeding.  Injury to surrounding organs.  Anesthetic side effects.   PROCEDURE   You may be given a medicine to help you relax (sedative) before the procedure. You will be given a medicine to make you sleep (general anesthetic) during the procedure.  A tube will be put down your throat to help your breath while under general anesthesia.  Several small cuts (incisions) are made in the lower abdominal area and one incision is made near the belly button.  Your abdominal area will be inflated with a safe gas (carbon dioxide). This helps give the surgeon room to operate, visualize, and helps the surgeon avoid other organs.  A thin, lighted tube (laparoscope) with a camera attached is inserted into your abdomen through the incision near the belly button. Other small instruments may also be inserted through other abdominal incisions.  The ovary is located and are removed.  After the ovary is removed, the gas is released from the abdomen.  The incisions will be closed with stitches (sutures), and Dermabond. A bandage may be placed over the incisions.  AFTER THE PROCEDURE   You will also have some mild abdominal discomfort for 3-7 days. You will be given pain medicine to ease any discomfort.  As long as there are no  problems, you may be allowed to go home. Someone will need to drive you home and be with you for at least 24 hours once home.  You may have some mild discomfort in the throat. This is from the tube placed in your throat while you were sleeping.  You may experience discomfort in the shoulder area from some trapped air between the liver and diaphragm. This sensation is normal and will slowly go away on its own.  HOME CARE INSTRUCTIONS   Take all medicines as directed.  Only take over-the-counter or prescription medicines for pain, discomfort, or fever as directed by your caregiver.  Resume daily activities as directed.  Showers are preferred over baths for 2 weeks.  You may resume sexual activities in 1 week or as you feel you would like to.  Do not drive while taking narcotics.  SEEK MEDICAL CARE IF: .  There is increasing abdominal pain.  You feel lightheaded or faint.  You have the chills.  You have an oral temperature above 102 F (38.9 C).  There is pus-like (purulent) drainage from any of the wounds.  You are unable to pass gas or have a bowel movement.  You feel sick to your stomach (nauseous) or throw up (vomit) and can't control it with your medicines.  MAKE SURE YOU:   Understand these instructions.  Will watch your condition.  Will get help right away if you are not doing well or get worse.  ExitCare Patient Information 2013 [place], [place].  AMBULATORY SURGERY  DISCHARGE INSTRUCTIONS   1) The drugs that you were given will stay in your system until tomorrow so for the next 24 hours you should not:  A) Drive an automobile B) Make any legal decisions C) Drink any alcoholic beverage   2) You may resume regular meals tomorrow.  Today it is better to start with liquids and gradually work up to solid foods.  You may eat anything you prefer, but it is better to start with liquids, then soup and crackers, and gradually work up to solid  foods.   3) Please notify your doctor immediately if you have any unusual bleeding, trouble breathing, redness and pain at the surgery site, drainage, fever, or pain not relieved by medication.    4) Additional Instructions:        Please contact your physician with any problems or Same Day Surgery at [phone], Monday through Friday 6 am to 4 pm, or [place] at [facility] number at [phone].

## 2020-08-02 NOTE — Transfer of Care (Signed)
Immediate Anesthesia Transfer of Care Note  Patient: Krista [name]  Procedure(s) Performed: XI ROBOT ASSISTED DIAGNOSTIC LAPAROSCOPY, REMOVAL AND LOCATION OF IUD (N/A Abdomen) INTRAUTERINE DEVICE (IUD) INSERTION  Patient Location: PACU  Anesthesia Type:General  Level of Consciousness: drowsy  Airway & Oxygen Therapy: Patient Spontanous Breathing and Patient connected to face mask oxygen  Post-op Assessment: Report given to RN  Post vital signs: stable  Last Vitals:  Vitals Value Taken Time  BP    Temp    Pulse    Resp    SpO2      Last Pain:  Vitals:   08/02/20 1257  TempSrc: Temporal  PainSc: 0-No pain      Patients Stated Pain Goal: 0 (08/02/20 1257)  Complications: No complications documented.

## 2020-08-03 ENCOUNTER — Encounter: Payer: Self-pay | Admitting: Obstetrics and Gynecology

## 2020-08-03 NOTE — Anesthesia Postprocedure Evaluation (Signed)
Anesthesia Post Note  Patient: Krista [name]  Procedure(s) Performed: XI ROBOT ASSISTED DIAGNOSTIC LAPAROSCOPY, REMOVAL AND LOCATION OF IUD (N/A Abdomen) INTRAUTERINE DEVICE (IUD) INSERTION  Patient location during evaluation: PACU Anesthesia Type: General Level of consciousness: awake and alert and oriented Pain management: pain level controlled Vital Signs Assessment: post-procedure vital signs reviewed and stable Respiratory status: spontaneous breathing Cardiovascular status: blood pressure returned to baseline Anesthetic complications: no   No complications documented.   Last Vitals:  Vitals:   08/02/20 1830 08/02/20 1833  BP: 118/84   Pulse: 68 77  Resp: 10 16  Temp:    SpO2: 96% 97%    Last Pain:  Vitals:   08/02/20 1830  TempSrc:   PainSc: 2                  Jamin Panther

## 2022-07-18 ENCOUNTER — Ambulatory Visit
Admission: EM | Admit: 2022-07-18 | Discharge: 2022-07-18 | Disposition: A | Discharge status: Home / Self Care | Payer: BC Managed Care – PPO | Attending: Emergency Medicine | Admitting: Emergency Medicine

## 2022-07-18 DIAGNOSIS — S61011A Laceration without foreign body of right thumb without damage to nail, initial encounter: Secondary | ICD-10-CM | POA: Diagnosis not present

## 2022-07-18 MED ORDER — CEPHALEXIN 500 MG PO CAPS: 500.0000 mg | ORAL_CAPSULE | Freq: Three times a day (TID) | ORAL | 0 refills | Status: AC

## 2022-07-18 NOTE — Discharge Instructions (Signed)
Keep the wound clean and dry.  Change the dressing if it becomes soiled or wet.  Change at least daily.  You do not need to apply any topical ointment.  Take the Keflex 500 mg 3 times daily for 5 days to prevent infection.  If you develop any redness, swelling, increased pain, puslike drainage, or red streaks going up your finger.  Also if you develop a fever.  Please return for reevaluation or follow-up in the ER.

## 2022-07-18 NOTE — ED Triage Notes (Addendum)
Pt c/o R thumb laceration x1 day, using mandoline slicer, last tetanus 0300
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
## 2022-08-02 IMAGING — CT CT RENAL STONE PROTOCOL
3 of 4 series · 8 of 46 positions shown, 15 images · non-contrast
Comparison: None.

CLINICAL DATA: Right flank pain with nausea. History of kidney
stones.

EXAM:
CT ABDOMEN AND PELVIS WITHOUT CONTRAST
TECHNIQUE: Multidetector CT imaging of the abdomen and pelvis was performed
following the standard protocol without IV contrast.

[Series 4: lung bases · axial · 0.69mm/px · z∈[-111,-56]mm · 4 of 19 slices shown, 9 images]
[im 4/19  soft-tissue]
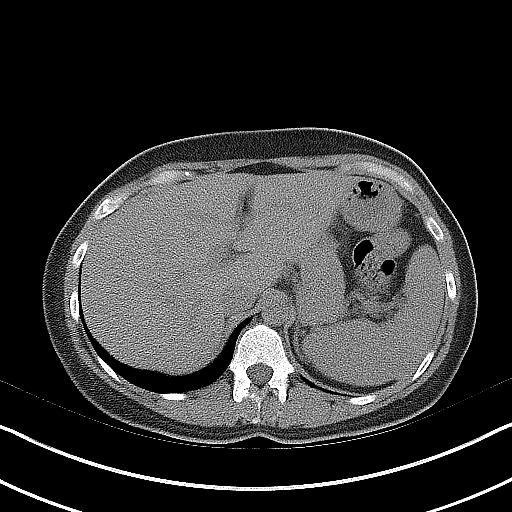
[im 4/19  lung]
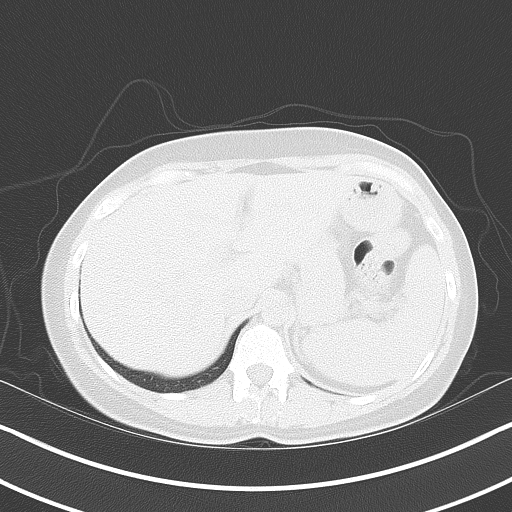
[im 4/19  bone]
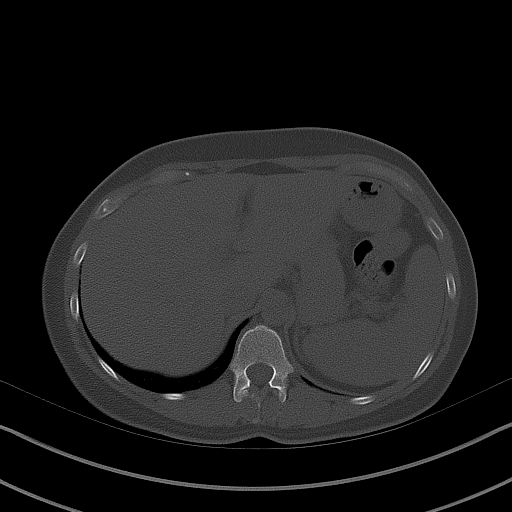
[im 8/19  soft-tissue]
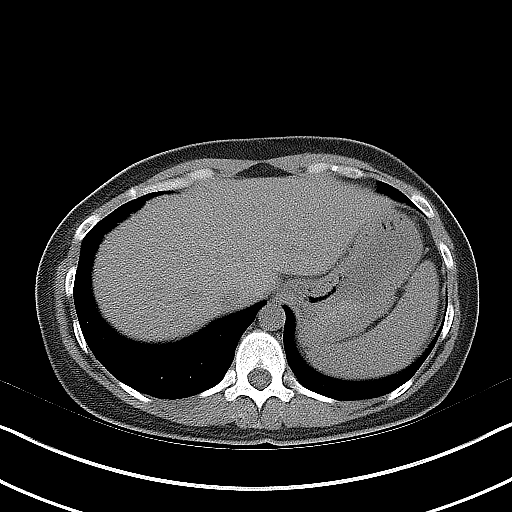
[im 8/19  lung]
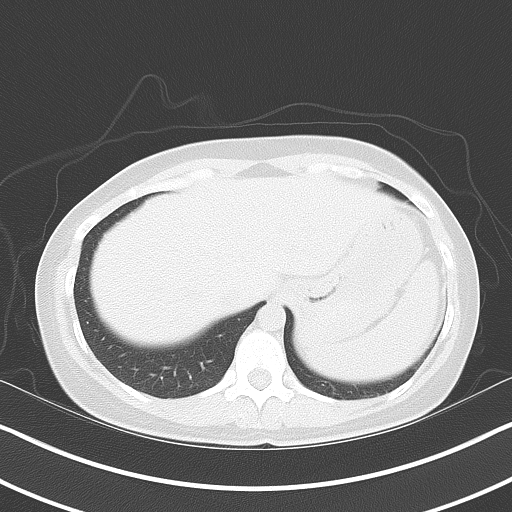
[im 11/19  soft-tissue]
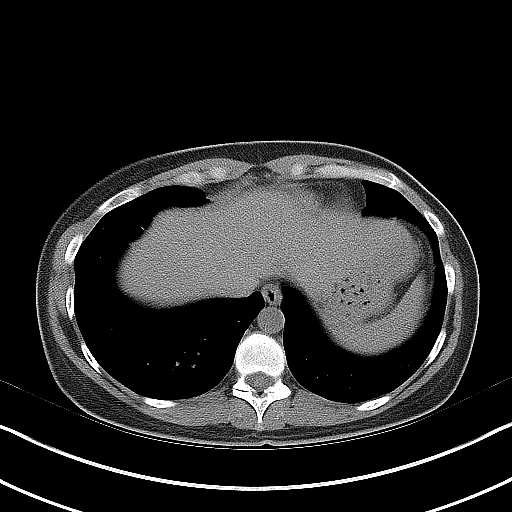
[im 11/19  lung]
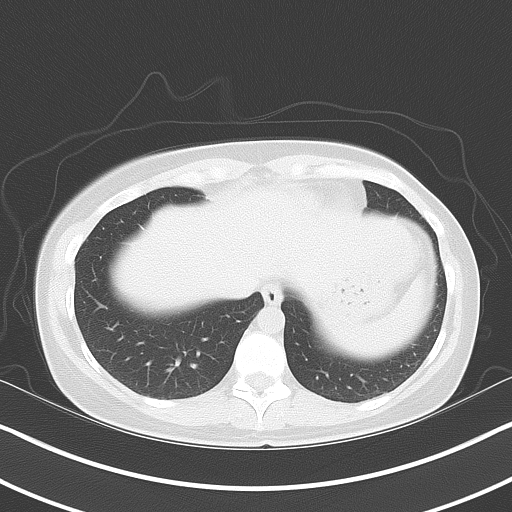
[im 15/19  soft-tissue]
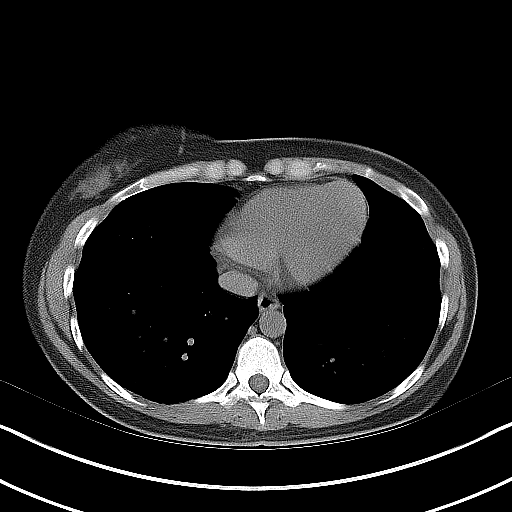
[im 15/19  lung]
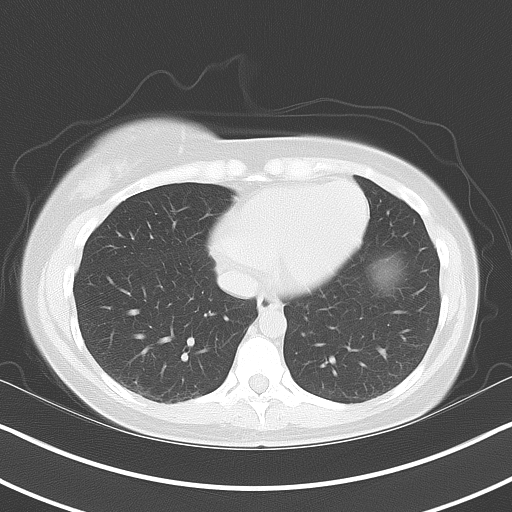

[Series 5: coronal · coronal · 0.72mm/px · 3 of 129 slices shown, 4 images]
[im 43/129  soft-tissue]
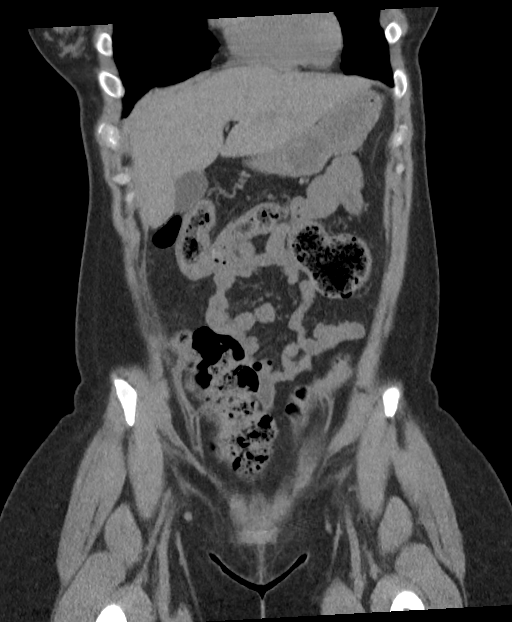
[im 57/129  soft-tissue]
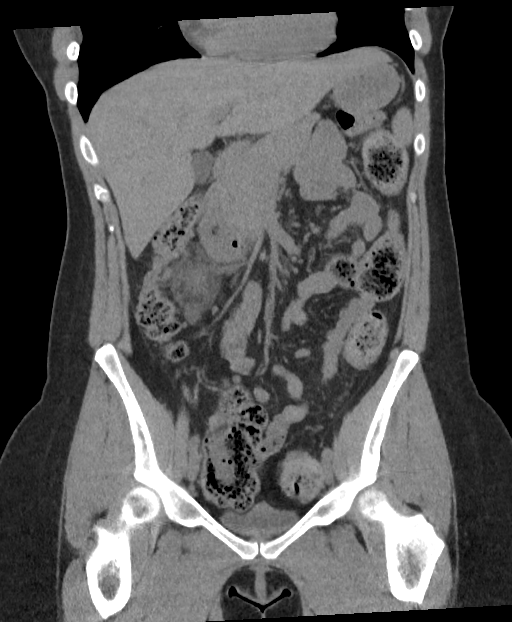
[im 57/129  bone]
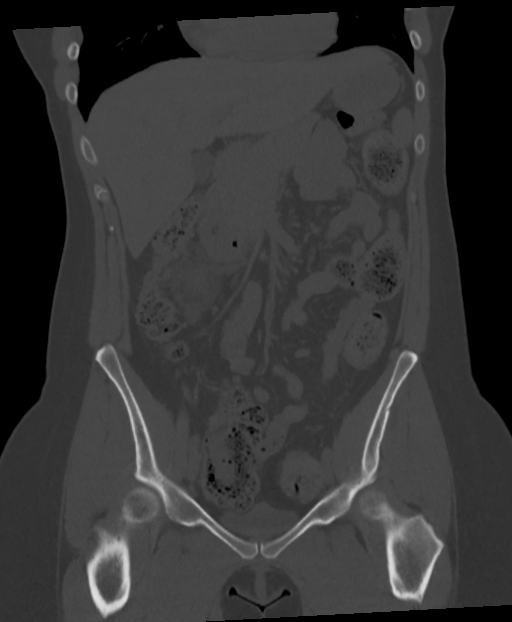
[im 72/129  soft-tissue]
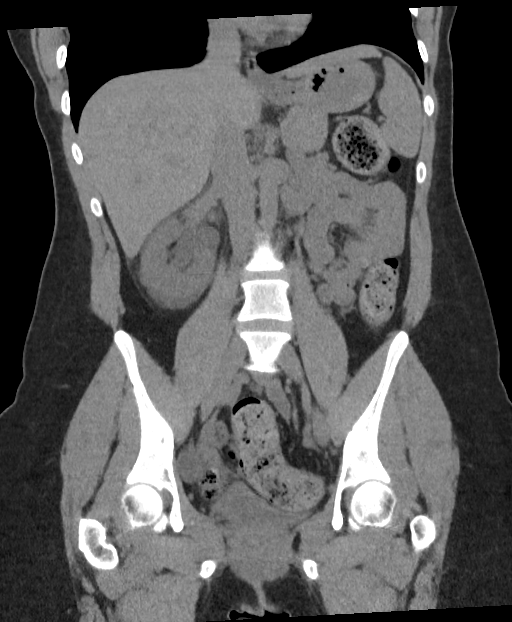

[Series 6: sagittal · sagittal · 0.50mm/px · 1 of 186 slices shown, 2 images]
[im 62/186  soft-tissue]
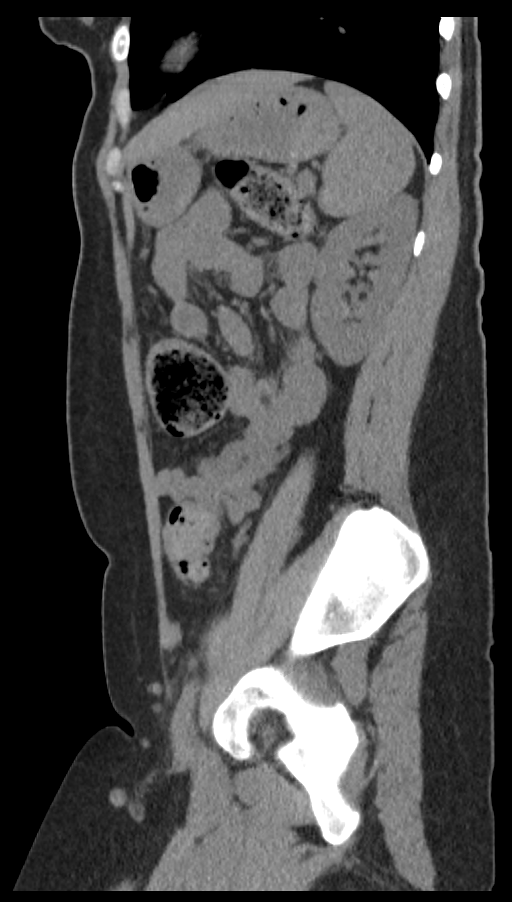
[im 62/186  bone]
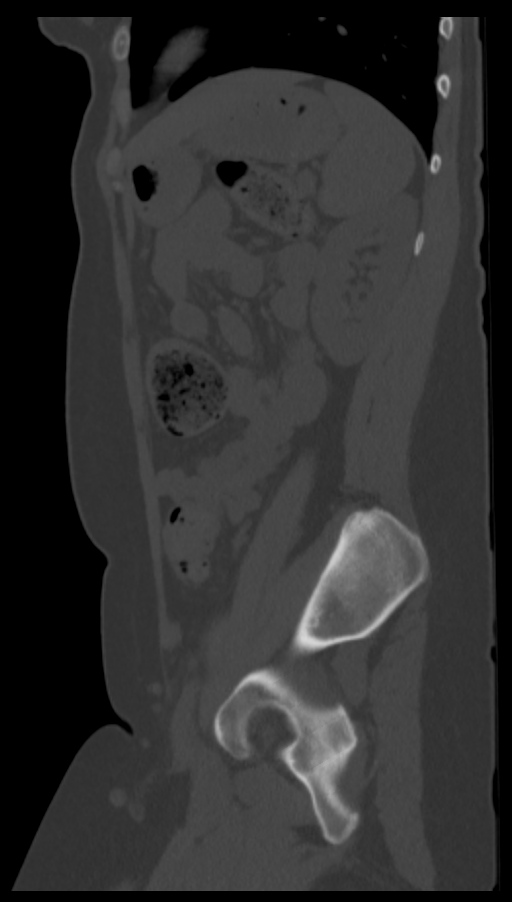

[8 of 46 positions shown; findings below may reference images not displayed]

FINDINGS: Lower chest: Clear lung bases.

Hepatobiliary: No focal liver abnormality is seen. No gallstones,
gallbladder wall thickening, or biliary dilatation.

Pancreas: Unremarkable.

Spleen: Unremarkable.

Adrenals/Urinary Tract: Unremarkable adrenal glands and left kidney.
4 mm stone at the right ureteropelvic junction resulting in moderate
hydronephrosis, renal edema, and perinephric stranding. Decompressed
distal ureter. Unremarkable bladder.

Stomach/Bowel: The stomach is unremarkable. There is no evidence of
bowel obstruction or inflammation. The appendix is unremarkable.

Vascular/Lymphatic: Normal caliber of the abdominal aorta. No
enlarged lymph nodes.

Reproductive: An IUD is present along the right aspect of the
uterine fundus and appears largely if not completely outside of the
uterus. Unremarkable ovaries.

Other: No ascites or pneumoperitoneum.

Musculoskeletal: No acute osseous abnormality or suspicious osseous
lesion.
IMPRESSION: 1. 4 mm right UPJ stone with moderate hydronephrosis.
2. IUD related uterine perforation with the device displaced into
the right adnexa.
